# Patient Record
Sex: Female | Born: 1970
Health system: Southern US, Community
[De-identification: ages and names within clinical notes are randomized; demographics above are authoritative.]

## PROBLEM LIST (undated history)

## (undated) DIAGNOSIS — G43829 Menstrual migraine, not intractable, without status migrainosus: Secondary | ICD-10-CM

## (undated) DIAGNOSIS — E7800 Pure hypercholesterolemia, unspecified: Secondary | ICD-10-CM

## (undated) DIAGNOSIS — E559 Vitamin D deficiency, unspecified: Secondary | ICD-10-CM

## (undated) DIAGNOSIS — N926 Irregular menstruation, unspecified: Secondary | ICD-10-CM

## (undated) DIAGNOSIS — I471 Supraventricular tachycardia, unspecified: Secondary | ICD-10-CM

## (undated) DIAGNOSIS — I441 Atrioventricular block, second degree: Secondary | ICD-10-CM

## (undated) HISTORY — DX: Irregular menstruation, unspecified: N92.6

## (undated) HISTORY — DX: Atrioventricular block, second degree: I44.1

## (undated) HISTORY — DX: Pure hypercholesterolemia, unspecified: E78.00

## (undated) HISTORY — PX: APPENDECTOMY: SHX54

## (undated) HISTORY — DX: Vitamin D deficiency, unspecified: E55.9

## (undated) HISTORY — DX: Menstrual migraine, not intractable, without status migrainosus: G43.829

## (undated) HISTORY — DX: Supraventricular tachycardia: I47.1

## (undated) HISTORY — DX: Supraventricular tachycardia, unspecified: I47.10

---
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
## 2008-01-11 ENCOUNTER — Other Ambulatory Visit: Admission: RE | Admit: 2008-01-11 | Discharge: 2008-01-11 | Payer: Self-pay | Admitting: Family Medicine

## 2009-05-08 ENCOUNTER — Other Ambulatory Visit: Admission: RE | Admit: 2009-05-08 | Discharge: 2009-05-08 | Payer: Self-pay | Admitting: Family Medicine

## 2010-05-09 ENCOUNTER — Other Ambulatory Visit: Admission: RE | Admit: 2010-05-09 | Discharge: 2010-05-09 | Payer: Self-pay | Admitting: Family Medicine

## 2012-08-24 ENCOUNTER — Encounter: Payer: Self-pay | Admitting: *Deleted

## 2012-09-01 ENCOUNTER — Other Ambulatory Visit (HOSPITAL_COMMUNITY)
Admission: RE | Admit: 2012-09-01 | Discharge: 2012-09-01 | Disposition: A | Discharge status: Home / Self Care | Payer: Managed Care, Other (non HMO) | Source: Ambulatory Visit | Attending: Family Medicine | Admitting: Family Medicine

## 2012-09-01 ENCOUNTER — Ambulatory Visit: Payer: Managed Care, Other (non HMO) | Admitting: Family Medicine

## 2012-09-01 ENCOUNTER — Other Ambulatory Visit: Payer: Self-pay

## 2012-09-01 ENCOUNTER — Encounter: Payer: Self-pay | Admitting: Family Medicine

## 2012-09-01 VITALS — BP 120/84 | HR 64 | Ht 67.0 in | Wt 140.0 lb

## 2012-09-01 DIAGNOSIS — Z0000 Encounter for general adult medical examination without abnormal findings: Secondary | ICD-10-CM

## 2012-09-01 DIAGNOSIS — N760 Acute vaginitis: Secondary | ICD-10-CM

## 2012-09-01 DIAGNOSIS — E7800 Pure hypercholesterolemia, unspecified: Secondary | ICD-10-CM

## 2012-09-01 DIAGNOSIS — N923 Ovulation bleeding: Secondary | ICD-10-CM

## 2012-09-01 DIAGNOSIS — Z01419 Encounter for gynecological examination (general) (routine) without abnormal findings: Secondary | ICD-10-CM | POA: Insufficient documentation

## 2012-09-01 DIAGNOSIS — R5381 Other malaise: Secondary | ICD-10-CM

## 2012-09-01 DIAGNOSIS — R5383 Other fatigue: Secondary | ICD-10-CM

## 2012-09-01 DIAGNOSIS — E559 Vitamin D deficiency, unspecified: Secondary | ICD-10-CM

## 2012-09-01 DIAGNOSIS — Z113 Encounter for screening for infections with a predominantly sexual mode of transmission: Secondary | ICD-10-CM | POA: Insufficient documentation

## 2012-09-01 DIAGNOSIS — Z1231 Encounter for screening mammogram for malignant neoplasm of breast: Secondary | ICD-10-CM

## 2012-09-01 DIAGNOSIS — Z1151 Encounter for screening for human papillomavirus (HPV): Secondary | ICD-10-CM | POA: Insufficient documentation

## 2012-09-01 LAB — POCT URINALYSIS DIPSTICK
Bilirubin, UA: NEGATIVE
Glucose, UA: NEGATIVE
Ketones, UA: NEGATIVE
pH, UA: 7

## 2012-09-01 LAB — CBC WITH DIFFERENTIAL/PLATELET
Eosinophils Absolute: 0 10*3/uL (ref 0.0–0.7)
Eosinophils Relative: 1 % (ref 0–5)
HCT: 37.9 % (ref 36.0–46.0)
Hemoglobin: 12.9 g/dL (ref 12.0–15.0)
Lymphocytes Relative: 35 % (ref 12–46)
Lymphs Abs: 1.8 10*3/uL (ref 0.7–4.0)
MCH: 29.5 pg (ref 26.0–34.0)
MCV: 86.7 fL (ref 78.0–100.0)
Monocytes Relative: 8 % (ref 3–12)
RBC: 4.37 MIL/uL (ref 3.87–5.11)
WBC: 5.1 10*3/uL (ref 4.0–10.5)

## 2012-09-01 LAB — COMPREHENSIVE METABOLIC PANEL
Alkaline Phosphatase: 48 U/L (ref 39–117)
BUN: 12 mg/dL (ref 6–23)
CO2: 28 mEq/L (ref 19–32)
Creat: 0.74 mg/dL (ref 0.50–1.10)
Glucose, Bld: 81 mg/dL (ref 70–99)
Sodium: 138 mEq/L (ref 135–145)
Total Bilirubin: 0.5 mg/dL (ref 0.3–1.2)
Total Protein: 7.3 g/dL (ref 6.0–8.3)

## 2012-09-01 LAB — LIPID PANEL
HDL: 75 mg/dL (ref >39)
Total CHOL/HDL Ratio: 3.3 Ratio
Triglycerides: 87 mg/dL (ref <150)

## 2012-09-01 NOTE — Patient Instructions (Addendum)
HEALTH MAINTENANCE RECOMMENDATIONS:  It is recommended that you get at least 30 minutes of aerobic exercise at least 5 days/week (for weight loss, you may need as much as 60-90 minutes). This can be any activity that gets your heart rate up. This can be divided in 10-15 minute intervals if needed, but try and build up your endurance at least once a week.  Weight bearing exercise is also recommended twice weekly.  Eat a healthy diet with lots of vegetables, fruits and fiber.  "Colorful" foods have a lot of vitamins (ie green vegetables, tomatoes, red peppers, etc).  Limit sweet tea, regular sodas and alcoholic beverages, all of which has a lot of calories and sugar.  Up to 1 alcoholic drink daily may be beneficial for women (unless trying to lose weight, watch sugars).  Drink a lot of water.  Calcium recommendations are 1200-1500 mg daily (1500 mg for postmenopausal women or women without ovaries), and vitamin D 1000 IU daily.  This should be obtained from diet and/or supplements (vitamins), and calcium should not be taken all at once, but in divided doses.  Monthly self breast exams and yearly mammograms for women over the age of 65 is recommended.  Sunscreen of at least SPF 30 should be used on all sun-exposed parts of the skin when outside between the hours of 10 am and 4 pm (not just when at beach or pool, but even with exercise, golf, tennis, and yard work!)  Use a sunscreen that says "broad spectrum" so it covers both UVA and UVB rays, and make sure to reapply every 1-2 hours.  Remember to change the batteries in your smoke detectors when changing your clock times in the spring and fall.  Use your seat belt every time you are in a car, and please drive safely and not be distracted with cell phones and texting while driving.  Schedule appointment with dermatologist to evaluate mole on your chest, and full skin check. Drs. Elmon Else and [place] are near [facility]  is behind [employer], off of [address].

## 2012-09-01 NOTE — Progress Notes (Signed)
Chief Complaint  Patient presents with  . Annual Exam    former patient fasting annual with pap. Would like to discuss possibly getting DEXA scan. And also would llike to discuss her heart rate while she is exercising, it is very high. UA showed trace leuks, pt states she is aymptomatic.   Martha Barton is a 42 y.o. female who presents for a complete physical.  She has the following concerns:  H/o irregular periods.  They are only somewhat irregular now, but still intermittently has some spotting between cycles.  This used to occur even on on OCP's.  Sometime bleeds after intercourse.  Sometimes has vaginal discharge, but denies odor, itching.  Denies risks for STD's.  Headaches with menses are improved compared to previously.  Interested in getting bone density due to poor calcium intake as a child, and family history (mother took a lot of prednisone, and had early menopause).  Immunization History  Administered Date(s) Administered  . Influenza Split 05/08/2009, 05/09/2010  . Td 06/22/2005   Last Pap smear: 04/2010; required repeat pap once in past, but no treatment needed Last mammogram: never Last colonoscopy: never Last DEXA: never Dentist: twice yearly Ophtho: 2012 (every 2 years) Exercise:  4x/week cardio and weights  Past Medical History  Diagnosis Date  . Irregular menses   . Unspecified vitamin D deficiency   . Menstrual headache     Past Surgical History  Procedure Laterality Date  . Appendectomy      History   Social History  . Marital Status: Married    Spouse Name: N/A    Number of Children: N/A  . Years of Education: N/A   Occupational History  . homemaker    Social History Main Topics  . Smoking status: Never Smoker   . Smokeless tobacco: Never Used  . Alcohol Use: Yes     Comment: glass of wine maybe every couple of weeks.  . Drug Use: No  . Sexually Active: Yes    Birth Control/ Protection: Condom   Other Topics Concern  . Not on file    Social History Narrative   Futures trader (former Runner, broadcasting/film/video).  Lives with husband and 2 children. 2 [place] pigs    Family History  Problem Relation Age of Onset  . Allergies Mother   . Hyperlipidemia Mother     elevated cholesterol  . Osteoporosis Mother   . Hypertension Father   . Diabetes Father   . Coronary artery disease Father     CABG (late 29's)  . Osteoporosis Maternal Grandmother   . Cancer Maternal Grandmother     skin cancer  . Heart disease Maternal Grandfather   . Cancer Paternal Grandfather     melanoma  . Heart disease Paternal Grandfather   . Colon cancer Neg Hx   . Breast cancer Neg Hx     Current outpatient prescriptions:Multiple Vitamins-Minerals (MULTIVITAMIN WITH MINERALS) tablet, Take 1 tablet by mouth daily., Disp: , Rfl: ;  calcium carbonate (TUMS - DOSED IN MG ELEMENTAL CALCIUM) 500 MG chewable tablet, Chew 1 tablet by mouth 2 (two) times daily., Disp: , Rfl:   Admits to being sporadic in Tums intake for calcium supplementation  No Known Allergies  ROS:  The patient denies anorexia, fever, weight changes, vision changes, decreased hearing, ear pain, sore throat, breast concerns, chest pain, palpitations, dizziness, syncope, dyspnea on exertion, cough, swelling, nausea, vomiting, diarrhea, constipation, abdominal pain, melena, hematochezia, indigestion/heartburn, hematuria, incontinence, dysuria, genital lesions, joint pains, numbness, tingling, weakness, tremor, depression,  anxiety, abnormal bleeding/bruising, or enlarged lymph nodes. Mild headache with periods. Some color change to mole on her chest   PHYSICAL EXAM: BP 120/84  Pulse 64  Ht 5\' 7"  (1.702 m)  Wt 140 lb (63.504 kg)  BMI 21.92 kg/m2  LMP 08/17/2012  General Appearance:    Alert, cooperative, no distress, appears stated age  Head:    Normocephalic, without obvious abnormality, atraumatic  Eyes:    PERRL, conjunctiva/corneas clear, EOM's intact, fundi    benign  Ears:    Normal TM's and  external ear canals  Nose:   Nares normal, mucosa normal, no drainage or sinus   tenderness  Throat:   Lips, mucosa, and tongue normal; teeth and gums normal  Neck:   Supple, no lymphadenopathy;  thyroid:  no   enlargement/tenderness/nodules; no carotid   bruit or JVD  Back:    Spine nontender, no curvature, ROM normal, no CVA     tenderness  Lungs:     Clear to auscultation bilaterally without wheezes, rales or     ronchi; respirations unlabored  Chest Wall:    No tenderness or deformity   Heart:    Regular rate and rhythm, S1 and S2 normal, no murmur, rub   or gallop  Breast Exam:    No tenderness, masses, or nipple discharge or inversion.      No axillary lymphadenopathy.  Slight dryness/crusting at nipples bilaterally  Abdomen:     Soft, non-tender, nondistended, normoactive bowel sounds,    no masses, no hepatosplenomegaly  Genitalia:    Normal external genitalia without lesions.  BUS and vagina normal; cervix is moderately inflamed, with ectropion that bleeds just with q-tip used to clean up discharge.  Friable cervix.  Moderate amount of creamy vaginal discharge in vault.  No cervical motion tenderness.  Uterus and adnexa not enlarged, nontender, no masses.  Pap performed  Rectal:    Not examined  Extremities:   No clubbing, cyanosis or edema  Pulses:   2+ and symmetric all extremities  Skin:   Skin color, texture, turgor normal, no rashes. Mole left chest, between breasts.  Round, but irregular coloration (dark oval, with lighter portion superomedially).    Lymph nodes:   Cervical, supraclavicular, and axillary nodes normal  Neurologic:   CNII-XII intact, normal strength, sensation and gait; reflexes 2+ and symmetric throughout          Psych:   Normal mood, affect, hygiene and grooming.     ASSESSMENT/PLAN:  Routine general medical examination at a health care facility - Plan: Visual acuity screening, POCT Urinalysis Dipstick, Lipid panel, Comprehensive metabolic panel, CBC with  Differential, Vitamin D 25 hydroxy, TSH, Cytology - PAP, CANCELED: Cytology - PAP  Unspecified vitamin D deficiency - Plan: Vitamin D 25 hydroxy  Pure hypercholesterolemia - Plan: Lipid panel  Other malaise and fatigue - Plan: Comprehensive metabolic panel, CBC with Differential, TSH  Intermenstrual bleeding - Plan: Cytology - PAP  Vaginitis and vulvovaginitis - Plan: Cytology - PAP  Recommend skin check with dermatologist given skin type and many moles, with biopsy of large mole on breast with atypical coloration.  Discussed monthly self breast exams and yearly mammograms after the age of 64; at least 30 minutes of aerobic activity at least 5 days/week; proper sunscreen use reviewed; healthy diet, including goals of calcium and vitamin D intake and alcohol recommendations (less than or equal to 1 drink/day) reviewed; regular seatbelt use; changing batteries in smoke detectors.  Immunization recommendations discussed (see  below).  Colonoscopy recommendations reviewed, age 33.  Tetanus--had in 2007 prior to moving to [place].  No records to know if Td or Tdap.  Vaccine not available today.  Consider giving Tdap next year (unless she finds records that she truly had TdaP and not Td in 2007). Flu shots recommended

## 2012-09-02 ENCOUNTER — Encounter: Payer: Self-pay | Admitting: Family Medicine

## 2012-09-02 DIAGNOSIS — E7800 Pure hypercholesterolemia, unspecified: Secondary | ICD-10-CM | POA: Insufficient documentation

## 2012-09-02 DIAGNOSIS — E559 Vitamin D deficiency, unspecified: Secondary | ICD-10-CM | POA: Insufficient documentation

## 2012-09-02 HISTORY — DX: Pure hypercholesterolemia, unspecified: E78.00

## 2012-09-07 ENCOUNTER — Other Ambulatory Visit: Payer: Self-pay | Admitting: *Deleted

## 2012-09-07 DIAGNOSIS — B9689 Other specified bacterial agents as the cause of diseases classified elsewhere: Secondary | ICD-10-CM

## 2012-09-07 DIAGNOSIS — N760 Acute vaginitis: Secondary | ICD-10-CM

## 2012-09-07 MED ORDER — METRONIDAZOLE 500 MG PO TABS: 500.0000 mg | ORAL_TABLET | Freq: Two times a day (BID) | ORAL | Status: DC

## 2012-09-26 ENCOUNTER — Other Ambulatory Visit: Payer: Self-pay | Admitting: Dermatology

## 2012-10-10 ENCOUNTER — Ambulatory Visit
Admission: RE | Admit: 2012-10-10 | Discharge: 2012-10-10 | Disposition: A | Discharge status: Home / Self Care | Payer: Managed Care, Other (non HMO) | Source: Ambulatory Visit

## 2012-10-10 ENCOUNTER — Other Ambulatory Visit: Payer: Self-pay | Admitting: Family Medicine

## 2012-10-10 DIAGNOSIS — R928 Other abnormal and inconclusive findings on diagnostic imaging of breast: Secondary | ICD-10-CM

## 2012-10-10 DIAGNOSIS — Z1231 Encounter for screening mammogram for malignant neoplasm of breast: Secondary | ICD-10-CM

## 2012-10-24 ENCOUNTER — Ambulatory Visit
Admission: RE | Admit: 2012-10-24 | Discharge: 2012-10-24 | Disposition: A | Discharge status: Home / Self Care | Payer: Managed Care, Other (non HMO) | Source: Ambulatory Visit | Attending: Family Medicine | Admitting: Family Medicine

## 2012-10-24 DIAGNOSIS — R928 Other abnormal and inconclusive findings on diagnostic imaging of breast: Secondary | ICD-10-CM

## 2013-04-07 ENCOUNTER — Other Ambulatory Visit: Payer: Self-pay | Admitting: Family Medicine

## 2013-04-07 DIAGNOSIS — D242 Benign neoplasm of left breast: Secondary | ICD-10-CM

## 2013-04-27 ENCOUNTER — Other Ambulatory Visit: Payer: Self-pay

## 2013-04-28 ENCOUNTER — Ambulatory Visit
Admission: RE | Admit: 2013-04-28 | Discharge: 2013-04-28 | Disposition: A | Discharge status: Home / Self Care | Payer: Managed Care, Other (non HMO) | Source: Ambulatory Visit | Attending: Family Medicine | Admitting: Family Medicine

## 2013-04-28 DIAGNOSIS — D242 Benign neoplasm of left breast: Secondary | ICD-10-CM

## 2013-10-24 ENCOUNTER — Other Ambulatory Visit: Payer: Self-pay | Admitting: Family Medicine

## 2013-10-24 DIAGNOSIS — D242 Benign neoplasm of left breast: Secondary | ICD-10-CM

## 2013-11-01 ENCOUNTER — Ambulatory Visit
Admission: RE | Admit: 2013-11-01 | Discharge: 2013-11-01 | Disposition: A | Discharge status: Home / Self Care | Payer: Managed Care, Other (non HMO) | Source: Ambulatory Visit | Attending: Family Medicine | Admitting: Family Medicine

## 2013-11-01 DIAGNOSIS — D242 Benign neoplasm of left breast: Secondary | ICD-10-CM

## 2014-04-23 ENCOUNTER — Encounter: Payer: Self-pay | Admitting: Family Medicine

## 2015-07-09 ENCOUNTER — Other Ambulatory Visit: Payer: Self-pay | Admitting: Family Medicine

## 2015-07-09 DIAGNOSIS — N6320 Unspecified lump in the left breast, unspecified quadrant: Secondary | ICD-10-CM

## 2015-07-17 ENCOUNTER — Ambulatory Visit
Admission: RE | Admit: 2015-07-17 | Discharge: 2015-07-17 | Disposition: A | Discharge status: Home / Self Care | Payer: Managed Care, Other (non HMO) | Source: Ambulatory Visit | Attending: Family Medicine | Admitting: Family Medicine

## 2015-07-17 DIAGNOSIS — N6320 Unspecified lump in the left breast, unspecified quadrant: Secondary | ICD-10-CM

## 2016-02-11 NOTE — Progress Notes (Signed)
Chief Complaint  Patient presents with  . Annual Exam    fasting annual exam with pap. UA showed 2+ leuks. Did not do eye exam, goes to [facility] and is UTD. Thinks she may be going into menopause and would like to discuss.     Martha Barton is a 45 y.o. female who presents for a complete physical.  She has the following concerns:  Some irregular menses, +hot flashes and night sweats. They have been intermittent, a little better over the last few weeks.  Some cycles are very heavy, some are frequent, and some are late (just twice that she skipped a cycle).  Weight gain: She gained it fairly quickly, hasn't been able to lose it. No ongoing gain.  Snacks on nuts, some sweets. Doesn't have caloric beverages.  Vitamin D deficiency:  Level was low at 26 in 2014.  It was recommended at that time that she take a daily supplement. She has been taking a MVI gummy daily, as well as 1000 IU of Vitamin D daily.   Hyperlipidemia:   Lab Results  Component Value Date   CHOL 246 (H) 09/01/2012   HDL 75 09/01/2012   LDLCALC 154 (H) 09/01/2012   TRIG 87 09/01/2012   CHOLHDL 3.3 09/01/2012    Mole on her chest--had changed color at last visit in 2014, sent to dermatologist.  She had it removed by Dr. Tonia Brooms (long wait at office, wasn't happy).  Pathology showed: Skin , right med breast DYSPLASTIC NEVUS WITH MODERATE TO SEVERE ATYPIA She has been seeing Dr. Kellie Moor in [place], every 6 months at first, now yearly. Hopes to change to a closer dermatologist.  Immunization History  Administered Date(s) Administered  . Influenza Split 05/08/2009, 05/09/2010  . Td 06/22/2005   Last Pap smear: 08/2012--normal.  Showed BV (treated with flagyl); no high risk HPV detected Last mammogram: 06/2015 Last colonoscopy: never Last DEXA: never Dentist: twice yearly Ophtho:  every 2 years, last in 07/2015 Exercise:  4x/week cardio and weights  Past Medical History:  Diagnosis Date  . Irregular menses   .  Menstrual headache   . Unspecified vitamin D deficiency     Past Surgical History:  Procedure Laterality Date  . APPENDECTOMY      Social History   Social History  . Marital status: Married    Spouse name: N/A  . Number of children: N/A  . Years of education: N/A   Occupational History  . homemaker    Social History Main Topics  . Smoking status: Never Smoker  . Smokeless tobacco: Never Used  . Alcohol use Yes     Comment: glass of wine maybe every couple of weeks.  . Drug use: No  . Sexual activity: Yes    Partners: Male    Birth control/ protection: Condom   Other Topics Concern  . Not on file   Social History Narrative   Agricultural engineer (former Pharmacist, hospital).  Lives with husband and 2 children, 1 dog.  Mother-in-law moved in 06/2014.    Family History  Problem Relation Age of Onset  . Allergies Mother   . Hyperlipidemia Mother     elevated cholesterol  . Osteoporosis Mother   . Hypertension Father   . Diabetes Father   . Coronary artery disease Father     CABG (late 44's)  . Osteoporosis Maternal Grandmother   . Cancer Maternal Grandmother     skin cancer  . Heart disease Maternal Grandfather   . Cancer Paternal Grandfather  melanoma  . Heart disease Paternal Grandfather   . Colon cancer Neg Hx   . Breast cancer Neg Hx     Outpatient Encounter Prescriptions as of 02/12/2016  Medication Sig Note  . calcium carbonate (TUMS - DOSED IN MG ELEMENTAL CALCIUM) 500 MG chewable tablet Chew 1 tablet by mouth 2 (two) times daily. 02/12/2016: Takes 1 most day, for calcium supplementation (not heartburn)  . cholecalciferol (VITAMIN D) 1000 units tablet Take 1,000 Units by mouth daily.   . Multiple Vitamins-Minerals (MULTIVITAMIN WITH MINERALS) tablet Take 1 tablet by mouth daily.   . [DISCONTINUED] metroNIDAZOLE (FLAGYL) 500 MG tablet Take 1 tablet (500 mg total) by mouth 2 (two) times daily.    No facility-administered encounter medications on file as of 02/12/2016.      No Known Allergies   ROS:  The patient denies anorexia, fever, vision changes, decreased hearing, ear pain, sore throat, breast concerns, chest pain, palpitations, dizziness, syncope, dyspnea on exertion, cough, swelling, nausea, vomiting, diarrhea, constipation, abdominal pain, melena, hematochezia, indigestion/heartburn, hematuria, incontinence, dysuria, genital lesions, joint pains, numbness, tingling, weakness, tremor, depression, anxiety, abnormal bleeding/bruising, or enlarged lymph nodes. Mild headache with periods, no longer with every cycle, now more sporadic, relieved by OTC meds. She has gained about 10# since her last visit three years ago.  She noticed it fairly suddenly, and hasn't been able to lose it (not continuing to gain). Denies changes in hair/skin/bowels/moods/energy (no thyroid symptoms). Occasional palpitation, flutter. She continues to report that her pulse gets to 170-180 on the machines when exercising, doesn't think she is working hard enough if she keeps her pulse lower. Denies any urinary symptoms. Has some creamy, white vaginal discharge, without odor.  Denies abnormal spotting or bleeding after intercourse. (had in the past, when diagnosed with BV).  She does note some odor after intercourse.     PHYSICAL EXAM:  BP 122/76 (BP Location: Left Arm, Patient Position: Sitting, Cuff Size: Normal)   Pulse 68   Ht 5' 7.25" (1.708 m)   Wt 149 lb 12.8 oz (67.9 kg)   LMP 01/24/2016   BMI 23.29 kg/m    General Appearance:    Alert, cooperative, no distress, appears stated age  Head:    Normocephalic, without obvious abnormality, atraumatic  Eyes:    PERRL, conjunctiva/corneas clear, EOM's intact, fundi    benign  Ears:    Normal TM's and external ear canals  Nose:   Nares normal, mucosa mildly edematous with small amount of clear mucus; nosinus tenderness  Throat:   Lips, mucosa, and tongue normal; teeth and gums normal  Neck:   Supple, no lymphadenopathy;   thyroid:  no   enlargement/tenderness/nodules; no carotid   bruit or JVD  Back:    Spine nontender, no curvature, ROM normal, no CVA tenderness  Lungs:     Clear to auscultation bilaterally without wheezes, rales or ronchi; respirations unlabored  Chest Wall:    No tenderness or deformity   Heart:    Regular rate and rhythm, S1 and S2 normal, no murmur, rub   or gallop  Breast Exam:    No tenderness, masses, or nipple discharge or inversion.  No axillary lymphadenopathy.   Abdomen:     Soft, non-tender, nondistended, normoactive bowel sounds,    no masses, no hepatosplenomegaly  Genitalia:    Normal external genitalia without lesions.  BUS and vagina normal; cervix has ectropion, which was friable and bled upon obtaining pap sample.  There was mild-mod amount of white,  non-odorous discharge, that then became very bloody (so not examined under microscope). No cervical motion tenderness.  Uterus and adnexa not enlarged, nontender, no masses.  Pap performed  Rectal:    Normal sphincter tone, no mass.  Heme negative stool.  Extremities:   No clubbing, cyanosis or edema  Pulses:   2+ and symmetric all extremities  Skin:   Skin color, texture, turgor normal, no rashes.   Lymph nodes:   Cervical, supraclavicular, and axillary nodes normal  Neurologic:   CNII-XII intact, normal strength, sensation and gait; reflexes 2+ and symmetric throughout                  Psych:   Normal mood, affect, hygiene and grooming   ASSESSMENT/PLAN:  Annual physical exam - Plan: POCT Urinalysis Dipstick, Lipid panel, Comprehensive metabolic panel, CBC with Differential/Platelet, VITAMIN D 25 Hydroxy (Vit-D Deficiency, Fractures), TSH, Cytology - PAP [place]  Pure hypercholesterolemia - low cholesterol diet reviewed.  recheck today - Plan: Lipid panel  Vitamin D deficiency - compliant with daily supplement; recheck today - Plan: VITAMIN D 25 Hydroxy (Vit-D Deficiency, Fractures)  Menorrhagia with irregular cycle -  some perimenopausal symptoms.  OTC meds suggested, if needed - Plan: CBC with Differential/Platelet, TSH  Immunization due - Plan: Tdap vaccine greater than or equal to 7yo IM  Need for prophylactic vaccination and inoculation against influenza - Plan: Flu Vaccine QUAD 36+ mos PF IM (Fluarix & Fluzone Quad PF)  Vaginal discharge - monogamous, with h/o BV. pap sample being tested since d/c too bloody. treat with metrogel vaginal if abnl (declines oral rx again)   If +BV, treat with vaginal metronidazole, had side effects from oral   c-met, lipid, TSH, Vitamin D, CBC  Discussed insurance and flu shot.  Discussed monthly self breast exams and yearly mammograms; at least 30 minutes of aerobic activity at least 5 days/week, weight-bearing exercise at least 2x/week; proper sunscreen use reviewed; healthy diet, including goals of calcium and vitamin D intake and alcohol recommendations (less than or equal to 1 drink/day) reviewed; regular seatbelt use; changing batteries in smoke detectors.  Immunization recommendations--TdaP and flu shot today.  Colonoscopy recommendations reviewed, age 21.  Tetanus--had in 2007 prior to moving to [place].  No records to know if Td or Tdap.  Vaccine wasn't available at her last visit in 2014.  Given Tdap and flu shot today.

## 2016-02-12 ENCOUNTER — Ambulatory Visit (INDEPENDENT_AMBULATORY_CARE_PROVIDER_SITE_OTHER): Payer: Managed Care, Other (non HMO) | Admitting: Family Medicine

## 2016-02-12 ENCOUNTER — Encounter: Payer: Self-pay | Admitting: Family Medicine

## 2016-02-12 ENCOUNTER — Other Ambulatory Visit (HOSPITAL_COMMUNITY)
Admission: RE | Admit: 2016-02-12 | Discharge: 2016-02-12 | Disposition: A | Discharge status: Home / Self Care | Payer: Managed Care, Other (non HMO) | Source: Ambulatory Visit | Attending: Family Medicine | Admitting: Family Medicine

## 2016-02-12 VITALS — BP 122/76 | HR 68 | Ht 67.25 in | Wt 149.8 lb

## 2016-02-12 DIAGNOSIS — E7800 Pure hypercholesterolemia, unspecified: Secondary | ICD-10-CM

## 2016-02-12 DIAGNOSIS — Z23 Encounter for immunization: Secondary | ICD-10-CM

## 2016-02-12 DIAGNOSIS — Z01419 Encounter for gynecological examination (general) (routine) without abnormal findings: Secondary | ICD-10-CM | POA: Diagnosis present

## 2016-02-12 DIAGNOSIS — Z1151 Encounter for screening for human papillomavirus (HPV): Secondary | ICD-10-CM | POA: Insufficient documentation

## 2016-02-12 DIAGNOSIS — N921 Excessive and frequent menstruation with irregular cycle: Secondary | ICD-10-CM

## 2016-02-12 DIAGNOSIS — Z0000 Encounter for general adult medical examination without abnormal findings: Secondary | ICD-10-CM | POA: Diagnosis not present

## 2016-02-12 DIAGNOSIS — N760 Acute vaginitis: Secondary | ICD-10-CM | POA: Diagnosis present

## 2016-02-12 DIAGNOSIS — E559 Vitamin D deficiency, unspecified: Secondary | ICD-10-CM | POA: Diagnosis not present

## 2016-02-12 DIAGNOSIS — N898 Other specified noninflammatory disorders of vagina: Secondary | ICD-10-CM | POA: Diagnosis not present

## 2016-02-12 LAB — CBC WITH DIFFERENTIAL/PLATELET
BASOS PCT: 1 %
Basophils Absolute: 40 cells/uL (ref 0–200)
EOS PCT: 1 %
Eosinophils Absolute: 40 cells/uL (ref 15–500)
HEMATOCRIT: 38.6 % (ref 35.0–45.0)
HEMOGLOBIN: 12.8 g/dL (ref 11.7–15.5)
LYMPHS ABS: 1720 {cells}/uL (ref 850–3900)
Lymphocytes Relative: 43 %
MCH: 29.4 pg (ref 27.0–33.0)
MCHC: 33.2 g/dL (ref 32.0–36.0)
MCV: 88.7 fL (ref 80.0–100.0)
MONO ABS: 440 {cells}/uL (ref 200–950)
MPV: 9.9 fL (ref 7.5–12.5)
Monocytes Relative: 11 %
NEUTROS ABS: 1760 {cells}/uL (ref 1500–7800)
NEUTROS PCT: 44 %
Platelets: 272 10*3/uL (ref 140–400)
RBC: 4.35 MIL/uL (ref 3.80–5.10)
RDW: 12.8 % (ref 11.0–15.0)
WBC: 4 10*3/uL (ref 4.0–10.5)

## 2016-02-12 LAB — POCT URINALYSIS DIPSTICK
BILIRUBIN UA: NEGATIVE
Blood, UA: NEGATIVE
GLUCOSE UA: NEGATIVE
Ketones, UA: NEGATIVE
NITRITE UA: NEGATIVE
Protein, UA: NEGATIVE
Spec Grav, UA: 1.01
Urobilinogen, UA: NEGATIVE
pH, UA: 6.5

## 2016-02-12 LAB — COMPREHENSIVE METABOLIC PANEL
ALK PHOS: 46 U/L (ref 33–115)
ALT: 14 U/L (ref 6–29)
AST: 22 U/L (ref 10–35)
Albumin: 4.4 g/dL (ref 3.6–5.1)
BUN: 12 mg/dL (ref 7–25)
CALCIUM: 9.3 mg/dL (ref 8.6–10.2)
CHLORIDE: 103 mmol/L (ref 98–110)
CO2: 24 mmol/L (ref 20–31)
Creat: 0.72 mg/dL (ref 0.50–1.10)
GLUCOSE: 78 mg/dL (ref 65–99)
POTASSIUM: 4.3 mmol/L (ref 3.5–5.3)
Sodium: 138 mmol/L (ref 135–146)
TOTAL PROTEIN: 6.8 g/dL (ref 6.1–8.1)
Total Bilirubin: 0.5 mg/dL (ref 0.2–1.2)

## 2016-02-12 LAB — LIPID PANEL
CHOLESTEROL: 221 mg/dL — AB (ref 125–200)
HDL: 77 mg/dL (ref >=46)
LDL CALC: 127 mg/dL (ref <130)
TRIGLYCERIDES: 83 mg/dL (ref <150)
Total CHOL/HDL Ratio: 2.9 Ratio (ref <=5.0)
VLDL: 17 mg/dL (ref <30)

## 2016-02-12 LAB — RESULTS CONSOLE HPV: CHL HPV: NEGATIVE

## 2016-02-12 LAB — TSH: TSH: 1.24 m[IU]/L

## 2016-02-12 LAB — HM PAP SMEAR: HM Pap smear: NEGATIVE

## 2016-02-12 NOTE — Patient Instructions (Signed)
HEALTH MAINTENANCE RECOMMENDATIONS:  It is recommended that you get at least 30 minutes of aerobic exercise at least 5 days/week (for weight loss, you may need as much as 60-90 minutes). This can be any activity that gets your heart rate up. This can be divided in 10-15 minute intervals if needed, but try and build up your endurance at least once a week.  Weight bearing exercise is also recommended twice weekly.  Eat a healthy diet with lots of vegetables, fruits and fiber.  "Colorful" foods have a lot of vitamins (ie green vegetables, tomatoes, red peppers, etc).  Limit sweet tea, regular sodas and alcoholic beverages, all of which has a lot of calories and sugar.  Up to 1 alcoholic drink daily may be beneficial for women (unless trying to lose weight, watch sugars).  Drink a lot of water.  Calcium recommendations are 1200-1500 mg daily (1500 mg for postmenopausal women or women without ovaries), and vitamin D 1000 IU daily.  This should be obtained from diet and/or supplements (vitamins), and calcium should not be taken all at once, but in divided doses.  Monthly self breast exams and yearly mammograms for women over the age of 69 is recommended.  Sunscreen of at least SPF 30 should be used on all sun-exposed parts of the skin when outside between the hours of 10 am and 4 pm (not just when at beach or pool, but even with exercise, golf, tennis, and yard work!)  Use a sunscreen that says "broad spectrum" so it covers both UVA and UVB rays, and make sure to reapply every 1-2 hours.  Remember to change the batteries in your smoke detectors when changing your clock times in the spring and fall.  Use your seat belt every time you are in a car, and please drive safely and not be distracted with cell phones and texting while driving.  Here is some information about bacterial vaginosis--the infection that we treated you for last time, and that we are checking for again this time.   Bacterial  Vaginosis Bacterial vaginosis is a vaginal infection that occurs when the normal balance of bacteria in the vagina is disrupted. It results from an overgrowth of certain bacteria. This is the most common vaginal infection in women of childbearing age. Treatment is important to prevent complications, especially in pregnant women, as it can cause a premature delivery. CAUSES  Bacterial vaginosis is caused by an increase in harmful bacteria that are normally present in smaller amounts in the vagina. Several different kinds of bacteria can cause bacterial vaginosis. However, the reason that the condition develops is not fully understood. RISK FACTORS Certain activities or behaviors can put you at an increased risk of developing bacterial vaginosis, including:  Having a new sex partner or multiple sex partners.  Douching.  Using an intrauterine device (IUD) for contraception. Women do not get bacterial vaginosis from toilet seats, bedding, swimming pools, or contact with objects around them. SIGNS AND SYMPTOMS  Some women with bacterial vaginosis have no signs or symptoms. Common symptoms include:  Grey vaginal discharge.  A fishlike odor with discharge, especially after sexual intercourse.  Itching or burning of the vagina and vulva.  Burning or pain with urination. DIAGNOSIS  Your health care provider will take a medical history and examine the vagina for signs of bacterial vaginosis. A sample of vaginal fluid may be taken. Your health care provider will look at this sample under a microscope to check for bacteria and abnormal cells. A  vaginal pH test may also be done.  TREATMENT  Bacterial vaginosis may be treated with antibiotic medicines. These may be given in the form of a pill or a vaginal cream. A second round of antibiotics may be prescribed if the condition comes back after treatment. Because bacterial vaginosis increases your risk for sexually transmitted diseases, getting treated can  help reduce your risk for chlamydia, gonorrhea, HIV, and herpes. HOME CARE INSTRUCTIONS   Only take over-the-counter or prescription medicines as directed by your health care provider.  If antibiotic medicine was prescribed, take it as directed. Make sure you finish it even if you start to feel better.  Tell all sexual partners that you have a vaginal infection. They should see their health care provider and be treated if they have problems, such as a mild rash or itching.  During treatment, it is important that you follow these instructions:  Avoid sexual activity or use condoms correctly.  Do not douche.  Avoid alcohol as directed by your health care provider.  Avoid breastfeeding as directed by your health care provider. SEEK MEDICAL CARE IF:   Your symptoms are not improving after 3 days of treatment.  You have increased discharge or pain.  You have a fever. MAKE SURE YOU:   Understand these instructions.  Will watch your condition.  Will get help right away if you are not doing well or get worse. FOR MORE INFORMATION  Centers for Disease Control and Prevention, Division of STD Prevention: [employer]raiser[employer]raud[employer] American Sexual Health Association (ASHA): www.ashastd.org    This information is not intended to replace advice given to you by your health care provider. Make sure you discuss any questions you have with your health care provider.   Document Released: 06/08/2005 Document Revised: 06/29/2014 Document Reviewed: 01/18/2013 Elsevier Interactive Patient Education [employer].

## 2016-02-13 LAB — VITAMIN D 25 HYDROXY (VIT D DEFICIENCY, FRACTURES): VIT D 25 HYDROXY: 38 ng/mL (ref 30–100)

## 2016-02-14 LAB — CYTOLOGY - PAP

## 2016-02-14 LAB — CERVICOVAGINAL ANCILLARY ONLY: BACTERIAL VAGINITIS: NEGATIVE

## 2016-02-17 ENCOUNTER — Telehealth: Payer: Self-pay | Admitting: Family Medicine

## 2016-02-17 NOTE — Telephone Encounter (Signed)
Pt states her arm that we drew blood from at her recent visit is hurting when she fully extends her arm and when she moves her wrist. Pt said she did notice immediately when her blood was drawn when she was here that she felt a sharp pain in her wrist as if a nerve may have been hit and she continued to have wrist a little while after but now having arm pain. Should she be concerned? What does she need to do?

## 2016-02-17 NOTE — Telephone Encounter (Signed)
Patient advised-no swelling or redness. She will try the NSAID and hot compress.

## 2016-02-17 NOTE — Telephone Encounter (Signed)
As long as the arm isn't swelling or red, there is no concern. She can try and NSAID like ibuprofen or aleve if there is any inflammation (if a nerve got irritated/inflamed), or a warm compress if any hematoma/clot/swelling. Return if fever, swelling, redness or new/developing symptoms

## 2017-02-15 ENCOUNTER — Encounter: Payer: Managed Care, Other (non HMO) | Admitting: Family Medicine

## 2017-04-26 ENCOUNTER — Other Ambulatory Visit: Payer: Self-pay | Admitting: Family Medicine

## 2017-04-26 DIAGNOSIS — Z1231 Encounter for screening mammogram for malignant neoplasm of breast: Secondary | ICD-10-CM

## 2017-05-24 ENCOUNTER — Ambulatory Visit
Admission: RE | Admit: 2017-05-24 | Discharge: 2017-05-24 | Disposition: A | Discharge status: Home / Self Care | Payer: Commercial Managed Care - PPO | Source: Ambulatory Visit | Attending: Family Medicine | Admitting: Family Medicine

## 2017-05-24 DIAGNOSIS — Z1231 Encounter for screening mammogram for malignant neoplasm of breast: Secondary | ICD-10-CM

## 2017-10-19 NOTE — Progress Notes (Signed)
Chief Complaint  Patient presents with  . Annual Exam    fasting annual exam with pelvic. Has eye exam scheduled. When she exercised her heart rate gets up to 180.   Martha Barton Flu Vaccine    did not get one.    Martha Barton is a 47 y.o. female who presents for a complete physical.  She has the following concerns:  She is concerned that her pulse is 170-180 when exercising at the gym.  She feels fine--not short of breath, able to speak. We have previously discussed her heart rate, and this hasn't changed.   Last period was 03/2017. +hot flashes and night sweats, fluctuates every few months in intensity. No significant vaginal dryness or dyspareunia.  Vitamin D deficiency:  Level was low at 26 in 2014.  It was was normal on last check in 01/2016 when taking a MVI gummy daily, as well as 1000 IU of Vitamin D daily. She continues to take both supplements daily.  Hyperlipidemia:  Lipids were high in 2014, improved on last check in 2017, see results below. She continues to try and follow a lowfat, low cholesterol diet. +FH heart disease. On vacation and visiting family the last couple of weeks.  She loves cheese and hard boiled eggs.  4 egg yolks/week.  RecentLabs       Lab Results  Component Value Date   CHOL 246 (H) 09/01/2012   HDL 75 09/01/2012   LDLCALC 154 (H) 09/01/2012   TRIG 87 09/01/2012   CHOLHDL 3.3 09/01/2012     Lab Results  Component Value Date   CHOL 221 (H) 02/12/2016   HDL 77 02/12/2016   LDLCALC 127 02/12/2016   TRIG 83 02/12/2016   CHOLHDL 2.9 02/12/2016    Immunization History  Administered Date(s) Administered  . Influenza Split 05/08/2009, 05/09/2010  . Influenza,inj,Quad PF,6+ Mos 02/12/2016  . Td 06/22/2005  . Tdap 02/12/2016   Did not get a flu shot this year. Last Pap smear: 01/2016 normal, no high risk HPV detected Last mammogram: 05/2017 Last colonoscopy: never Last DEXA: never Dentist: twice yearly Ophtho:  every other year, due Exercise:  4x/week cardio and weights   Past Medical History:  Diagnosis Date  . Hypercholesterolemia   . Irregular menses   . Menstrual headache   . Unspecified vitamin D deficiency     Past Surgical History:  Procedure Laterality Date  . APPENDECTOMY      Social History   Socioeconomic History  . Marital status: Married    Spouse name: Not on file  . Number of children: Not on file  . Years of education: Not on file  . Highest education level: Not on file  Occupational History  . Occupation: homemaker  Social Needs  . Financial resource strain: Not on file  . Food insecurity:    Worry: Not on file    Inability: Not on file  . Transportation needs:    Medical: Not on file    Non-medical: Not on file  Tobacco Use  . Smoking status: Never Smoker  . Smokeless tobacco: Never Used  Substance and Sexual Activity  . Alcohol use: Yes    Comment: glass of wine maybe every couple of weeks.  . Drug use: No  . Sexual activity: Yes    Partners: Male    Birth control/protection: Condom  Lifestyle  . Physical activity:    Days per week: Not on file    Minutes per session: Not on file  . Stress:  Not on file  Relationships  . Social connections:    Talks on phone: Not on file    Gets together: Not on file    Attends religious service: Not on file    Active member of club or organization: Not on file    Attends meetings of clubs or organizations: Not on file    Relationship status: Not on file  . Intimate partner violence:    Fear of current or ex partner: Not on file    Emotionally abused: Not on file    Physically abused: Not on file    Forced sexual activity: Not on file  Other Topics Concern  . Not on file  Social History Narrative   Agricultural engineer (former Pharmacist, hospital).  Lives with husband and 2 children (91 yo son, 54 yo daughter 09/2017), 1 dog.    Family History  Problem Relation Age of Onset  . Allergies Mother   . Hyperlipidemia Mother        elevated cholesterol  .  Osteoporosis Mother   . Hypertension Father   . Diabetes Father   . Coronary artery disease Father        CABG (late 61's)  . Osteoporosis Maternal Grandmother   . Cancer Maternal Grandmother        skin cancer  . Heart disease Maternal Grandfather   . Cancer Paternal Grandfather        melanoma  . Heart disease Paternal Grandfather   . Colon cancer Neg Hx   . Breast cancer Neg Hx     Outpatient Encounter Medications as of 10/20/2017  Medication Sig Note  . calcium carbonate (TUMS - DOSED IN MG ELEMENTAL CALCIUM) 500 MG chewable tablet Chew 1 tablet by mouth 2 (two) times daily. 02/12/2016: Takes 1 most day, for calcium supplementation (not heartburn)  . cholecalciferol (VITAMIN D) 1000 units tablet Take 1,000 Units by mouth daily.   . Multiple Vitamins-Minerals (MULTIVITAMIN WITH MINERALS) tablet Take 1 tablet by mouth daily.    No facility-administered encounter medications on file as of 10/20/2017.     No Known Allergies  ROS: The patient denies anorexia, fever, vision changes, decreased hearing, ear pain, sore throat, breast concerns, chest pain, palpitations, dizziness, syncope, dyspnea on exertion, cough, swelling, nausea, vomiting, diarrhea, constipation, abdominal pain, melena, hematochezia, indigestion/heartburn, hematuria, incontinence, dysuria, genital lesions, joint pains, numbness, tingling, weakness, tremor, depression, anxiety, abnormal bleeding/bruising, or enlarged lymph nodes. Denies changes in hair/skin/bowels/moods/energy (no thyroid symptoms). Occasional palpitation, flutter, none in 4-5 months. She continues to report that her pulse gets to 170-180 on the machines when exercising, doesn't think she is working hard enough if she keeps her pulse lower. Unchanged, and feels fine at that pulse while exercising. Hot flashes, night sweats per HPI, last cycle 69mos ago.   PHYSICAL EXAM:  BP 118/80   Pulse 76   Ht 5\' 7"  (1.702 m)   Wt 145 lb (65.8 kg)   LMP  03/22/2017 (Approximate)   BMI 22.71 kg/m   Wt Readings from Last 3 Encounters:  10/20/17 145 lb (65.8 kg)  02/12/16 149 lb 12.8 oz (67.9 kg)  09/01/12 140 lb (63.5 kg)    General Appearance:  Alert, cooperative, no distress, appears stated age  Head:  Normocephalic, without obvious abnormality, atraumatic  Eyes:  PERRL, conjunctiva/corneas clear, EOM's intact, fundi benign  Ears:  Normal TM's and external ear canals  Nose: Nares normal, mucosa mildly edematous, no erythema or purulence no sinus tenderness  Throat: Lips, mucosa, and tongue  normal; teeth and gums normal  Neck: Supple, no lymphadenopathy; thyroid: no enlargement/tenderness/ nodules; no carotid bruit or JVD  Back:  Spine nontender, no curvature, ROM normal, no CVAtenderness  Lungs:  Clear to auscultation bilaterally without wheezes, rales orronchi; respirations unlabored  Chest Wall:  No tenderness or deformity  Heart:  Regular rate and rhythm, S1 and S2 normal, no murmur, rub or gallop  Breast Exam:  No tenderness, masses, or nipple discharge or inversion. No axillary lymphadenopathy.   Abdomen:  Soft, non-tender, nondistended, normoactive bowel sounds,  no masses, no hepatosplenomegaly  Genitalia:  Normal external genitalia without lesions. BUS and vagina normal;  No cervical motion tenderness. Uterus and adnexa not enlarged, nontender, no masses. Pap not performed  Rectal:  Normal sphincter tone, no mass.  Heme negative stool.  Extremities: No clubbing, cyanosis or edema  Pulses: 2+ and symmetric all extremities  Skin: Skin color, texture, turgor normal, no rashes.   Lymph nodes: Cervical, supraclavicular, and axillary nodes normal  Neurologic: CNII-XII intact, normal strength, sensation and gait; reflexes 2+ and symmetric throughout  Psych: Normal mood, affect, hygiene and grooming   ASSESSMENT/PLAN:  Annual physical exam - Plan: POCT  Urinalysis DIP (Proadvantage Device), Lipid panel, TSH, Glucose, random  Pure hypercholesterolemia - previously improved with diet; due for recheck - Plan: Lipid panel  Vitamin D deficiency - continue supplements  Menopausal symptoms - discussed supplements; declined labs/FSH  Discussed pulse recommendations for exercise. She is maxing out recommended pulse, but has been for years, and can talk and feels fine. We discussed potential parasympathetic/sympathetic tone differences to account for her elevated heart rate. Encouraged her to go by how she feels rather than number, being able to speak.   Discussed monthly self breast exams and yearly mammograms; at least 30 minutes of aerobic activity at least 5 days/week, weight-bearing exercise at least 2x/week; proper sunscreen use reviewed; healthy diet, including goals of calcium and vitamin D intake and alcohol recommendations (less than or equal to 1 drink/day) reviewed; regular seatbelt use; changing batteries in smoke detectors. Immunization recommendations--UTD. Strongly encouraged yearly flu shots. Colonoscopy recommendations reviewed, age 65.   F/u 1 year, sooner prn

## 2017-10-20 ENCOUNTER — Encounter: Payer: Self-pay | Admitting: Family Medicine

## 2017-10-20 ENCOUNTER — Ambulatory Visit: Payer: Commercial Managed Care - PPO | Admitting: Family Medicine

## 2017-10-20 VITALS — BP 118/80 | HR 76 | Ht 67.0 in | Wt 145.0 lb

## 2017-10-20 DIAGNOSIS — N951 Menopausal and female climacteric states: Secondary | ICD-10-CM

## 2017-10-20 DIAGNOSIS — E559 Vitamin D deficiency, unspecified: Secondary | ICD-10-CM

## 2017-10-20 DIAGNOSIS — E7800 Pure hypercholesterolemia, unspecified: Secondary | ICD-10-CM

## 2017-10-20 DIAGNOSIS — Z0000 Encounter for general adult medical examination without abnormal findings: Secondary | ICD-10-CM | POA: Diagnosis not present

## 2017-10-20 LAB — POCT URINALYSIS DIP (PROADVANTAGE DEVICE)
Bilirubin, UA: NEGATIVE
Glucose, UA: NEGATIVE mg/dL
Ketones, POC UA: NEGATIVE mg/dL
Leukocytes, UA: NEGATIVE
Nitrite, UA: NEGATIVE
Protein Ur, POC: NEGATIVE mg/dL
RBC UA: NEGATIVE
SPECIFIC GRAVITY, URINE: 1.015
UUROB: NEGATIVE
pH, UA: 6 (ref 5.0–8.0)

## 2017-10-20 NOTE — Patient Instructions (Addendum)
  HEALTH MAINTENANCE RECOMMENDATIONS:  It is recommended that you get at least 30 minutes of aerobic exercise at least 5 days/week (for weight loss, you may need as much as 60-90 minutes). This can be any activity that gets your heart rate up. This can be divided in 10-15 minute intervals if needed, but try and build up your endurance at least once a week.  Weight bearing exercise is also recommended twice weekly.  Eat a healthy diet with lots of vegetables, fruits and fiber.  "Colorful" foods have a lot of vitamins (ie green vegetables, tomatoes, red peppers, etc).  Limit sweet tea, regular sodas and alcoholic beverages, all of which has a lot of calories and sugar.  Up to 1 alcoholic drink daily may be beneficial for women (unless trying to lose weight, watch sugars).  Drink a lot of water.  Calcium recommendations are 1200-1500 mg daily (1500 mg for postmenopausal women or women without ovaries), and vitamin D 1000 IU daily.  This should be obtained from diet and/or supplements (vitamins), and calcium should not be taken all at once, but in divided doses.  Monthly self breast exams and yearly mammograms for women over the age of 40 is recommended.  Sunscreen of at least SPF 30 should be used on all sun-exposed parts of the skin when outside between the hours of 10 am and 4 pm (not just when at beach or pool, but even with exercise, golf, tennis, and yard work!)  Use a sunscreen that says "broad spectrum" so it covers both UVA and UVB rays, and make sure to reapply every 1-2 hours.  Remember to change the batteries in your smoke detectors when changing your clock times in the spring and fall.  Use your seat belt every time you are in a car, and please drive safely and not be distracted with cell phones and texting while driving.   Consider Estroven (soy, black cohosh) as a supplement for menopausal symptoms, if needed.

## 2017-10-21 LAB — TSH: TSH: 1.25 u[IU]/mL (ref 0.450–4.500)

## 2017-10-21 LAB — GLUCOSE, RANDOM: Glucose: 79 mg/dL (ref 65–99)

## 2017-10-21 LAB — LIPID PANEL
CHOLESTEROL TOTAL: 279 mg/dL — AB (ref 100–199)
Chol/HDL Ratio: 3.6 ratio (ref 0.0–4.4)
HDL: 78 mg/dL (ref >39)
LDL Calculated: 186 mg/dL — ABNORMAL HIGH (ref 0–99)
Triglycerides: 76 mg/dL (ref 0–149)
VLDL CHOLESTEROL CAL: 15 mg/dL (ref 5–40)

## 2018-01-31 ENCOUNTER — Telehealth: Payer: Self-pay | Admitting: Family Medicine

## 2018-01-31 NOTE — Telephone Encounter (Signed)
Pt dropped off form to be filled out put in your folder, pt would like to be faxed after filled out, pt can be reached at [phone]

## 2018-01-31 NOTE — Telephone Encounter (Signed)
Done

## 2018-01-31 NOTE — Telephone Encounter (Signed)
FFO, copy/scan and fax please.

## 2018-08-02 ENCOUNTER — Other Ambulatory Visit: Payer: Self-pay | Admitting: Family Medicine

## 2018-08-02 DIAGNOSIS — Z1231 Encounter for screening mammogram for malignant neoplasm of breast: Secondary | ICD-10-CM

## 2018-08-31 ENCOUNTER — Other Ambulatory Visit: Payer: Self-pay

## 2018-08-31 ENCOUNTER — Ambulatory Visit
Admission: RE | Admit: 2018-08-31 | Discharge: 2018-08-31 | Disposition: A | Discharge status: Home / Self Care | Payer: Commercial Managed Care - PPO | Source: Ambulatory Visit | Attending: Family Medicine | Admitting: Family Medicine

## 2018-08-31 DIAGNOSIS — Z1231 Encounter for screening mammogram for malignant neoplasm of breast: Secondary | ICD-10-CM | POA: Diagnosis not present

## 2018-09-01 ENCOUNTER — Other Ambulatory Visit: Payer: Self-pay | Admitting: Family Medicine

## 2018-09-01 DIAGNOSIS — R928 Other abnormal and inconclusive findings on diagnostic imaging of breast: Secondary | ICD-10-CM

## 2018-09-06 ENCOUNTER — Other Ambulatory Visit: Payer: Self-pay

## 2018-09-06 ENCOUNTER — Ambulatory Visit
Admission: RE | Admit: 2018-09-06 | Discharge: 2018-09-06 | Disposition: A | Discharge status: Home / Self Care | Payer: Commercial Managed Care - PPO | Source: Ambulatory Visit | Attending: Family Medicine | Admitting: Family Medicine

## 2018-09-06 DIAGNOSIS — R928 Other abnormal and inconclusive findings on diagnostic imaging of breast: Secondary | ICD-10-CM

## 2018-09-06 DIAGNOSIS — R922 Inconclusive mammogram: Secondary | ICD-10-CM | POA: Diagnosis not present

## 2018-09-06 DIAGNOSIS — N6001 Solitary cyst of right breast: Secondary | ICD-10-CM | POA: Diagnosis not present

## 2018-09-08 DIAGNOSIS — D2261 Melanocytic nevi of right upper limb, including shoulder: Secondary | ICD-10-CM | POA: Diagnosis not present

## 2018-09-08 DIAGNOSIS — D485 Neoplasm of uncertain behavior of skin: Secondary | ICD-10-CM | POA: Diagnosis not present

## 2018-09-08 DIAGNOSIS — D225 Melanocytic nevi of trunk: Secondary | ICD-10-CM | POA: Diagnosis not present

## 2018-09-08 DIAGNOSIS — D2262 Melanocytic nevi of left upper limb, including shoulder: Secondary | ICD-10-CM | POA: Diagnosis not present

## 2018-09-08 DIAGNOSIS — L989 Disorder of the skin and subcutaneous tissue, unspecified: Secondary | ICD-10-CM | POA: Diagnosis not present

## 2018-12-08 ENCOUNTER — Encounter: Payer: Self-pay | Admitting: Family Medicine

## 2019-02-19 NOTE — Progress Notes (Signed)
Chief Complaint  Patient presents with  . Palpitations    and chest discomfort that has been going on for a few weeks. Would also like to have her cholesterol checked today.    Patient reports left sided chest discomfort, described as a dull throbbing.  Sometimes it is noted at the lateral left chest wall, described as achey.  Occurs at rest, driving, and with exercising. Denies positional change. Sometimes occurs with exercise, but actually has resolved while continuing to exercise. Once last week she stopped her exercise due to the pain, waiting to get checked out here.  Denies heartburn/reflux symptoms. No associated nausea or jaw pain, no arm pain. No associated shortness of breath.  She has palpitations mainly at night--either pounding, skipped beats, not usually fast, just hard. +anxiety, worried about this pain due to family history of heart disease, causing her to be very stressed. She has been having some trouble falling asleep. No other stressors, just worried about her health.  She was last seen by me at her CPE 10/2017, not scheduled for one this year. Previously expressed concern that her pulse is 170-180 when exercising at the gym.  She feels fine--not short of breath, able to speak.  No changes in her pulse with exercise. She hasn't been able to go to the gym related to COVID, but is exercising 4-5x/week, cardio and strength YouTube videos.   Irregular periods--had one 03/2017, then didn't have another until 03/2018; had another 05/2018, and none since. +hot flashes and night sweats, tolerable. No significant vaginal dryness or dyspareunia.  Vitamin D deficiency: Level was low at 26 in 2014. It was was normal on last check in 01/2016 (38) when taking a MVI gummy daily, as well as 1000 IU of Vitamin D daily. She currently still takes both supplements.  Hyperlipidemia:She has had her cholesterol be at goal with dietary changes, though on her last check 1 year ago, lipids  were back to being high.  She was counseled on cutting back on cheese and egg yolks (prior to last check she had been on vacation).  +FH heart disease. She has worked hard on limiting saturated fats and sugar.  She eats oatmeal daily, lots of salads, beans. She has some cheese, but cut back. She uses mainly vinaigrette dressings. No longer eating boiled eggs regularly.  Infrequent red meat. Has had more salt in her diet, soups, etc lately.   Lab Results  Component Value Date   CHOL 279 (H) 10/20/2017   HDL 78 10/20/2017   LDLCALC 186 (H) 10/20/2017   TRIG 76 10/20/2017   CHOLHDL 3.6 10/20/2017   PMH, PSH, SH reviewed  Outpatient Encounter Medications as of 02/20/2019  Medication Sig Note  . calcium carbonate (TUMS - DOSED IN MG ELEMENTAL CALCIUM) 500 MG chewable tablet Chew 1 tablet by mouth 2 (two) times daily. 02/12/2016: Takes 1 most day, for calcium supplementation (not heartburn)  . cholecalciferol (VITAMIN D) 1000 units tablet Take 1,000 Units by mouth daily.   . Multiple Vitamins-Minerals (MULTIVITAMIN WITH MINERALS) tablet Take 1 tablet by mouth daily.   . [DISCONTINUED] mupirocin ointment (BACTROBAN) 2 %     No facility-administered encounter medications on file as of 02/20/2019.    No Known Allergies  ROS: no fever, chills, headaches, dizziness, URI symptoms, cough, shortness of breath.  Chest pain/palpitations per HPI.  +anxiety per HPI. Hair loss over the last two years, thinning, no alopecia. No fatigue or change in weight, bowels, skin.  See HPI  PHYSICAL  EXAM:  BP 140/86   Pulse 80   Temp (!) 96.8 F (36 C) (Other (Comment))   Ht 5\' 8"  (1.727 m)   Wt 145 lb 6.4 oz (66 kg)   LMP 06/08/2018 (Approximate)   BMI 22.11 kg/m   Wt Readings from Last 3 Encounters:  02/20/19 145 lb 6.4 oz (66 kg)  10/20/17 145 lb (65.8 kg)  02/12/16 149 lb 12.8 oz (67.9 kg)   Well-appearing, slightly anxious female in no distress Repeat BP was higher (0000000 systolic) HEENT: conjunctiva  and sclera are clear, EOMI.  Wearing a mask due to COVID-19 Neck: no lymphadenopathy, thyromegaly or carotid bruit Heart: regular rate and rhythm, no murmur.  No ectopy. Chest: Tender at multiple levels L>R costochondral junctions.  No pain with deep breath Back: no spinal or CVA tenderness Abdomen: soft, nontender, no organomegaly or mass Extremities: no edema, normal pulses Psych: somwhat anxious.  Full range of affect, normal eye contact, speech, grooming.    EKG: sinus brady (rate of 54), no acute changes, no PVC's or PAC's noted.   ASSESSMENT/PLAN  Palpitations - Plan: TSH, CBC with Differential/Platelet, Comprehensive metabolic panel, EKG [id number]  Pure hypercholesterolemia - due for recheck; diet has improved - Plan: Lipid panel  Chest wall pain - some component of costochondritis--may not reproduce the discomfort she is having. Given that pain is at rest, and inconsistent with exertion, doubt cardiac  Vitamin D deficiency - continue supplements  Need for influenza vaccination - Plan: Flu Vaccine QUAD 6+ mos PF IM (Fluarix Quad PF)  Elevated BP without diagnosis of hypertension - suspect related to anxiety (was higher on recheck)--encouraged low Na diet, monitor elsewhere  Hair thinning - check thyroid test; hair/skin/nail viitamin. DDx reviewed  Discussed Ddx chest pain--EKG reassuring, as is history.  Recommended heat and NSAIDs to treat chest wall tenderness.  If pain persists, may need further evaluation.  Discussed potential false positives in stress tests if low probability based on history, so don't recommend it quite yet. Discussed risk factors for heart disease--elevated blood pressure and cholesterol.  Suspect lipids will be improved on repeat, as diet improved. Discussed low Na diet, effect of anxiety on BP, and to check BP elsewhere, if able.  Can definitely cut out some salt in her diet.  F/u for CPE, next available. Consider f/u in 1-2 weeks if symptoms persist  or worsen, or don't completely resolve.

## 2019-02-20 ENCOUNTER — Other Ambulatory Visit: Payer: Self-pay

## 2019-02-20 ENCOUNTER — Encounter: Payer: Self-pay | Admitting: Family Medicine

## 2019-02-20 ENCOUNTER — Ambulatory Visit: Payer: Commercial Managed Care - PPO | Admitting: Family Medicine

## 2019-02-20 VITALS — BP 140/86 | HR 80 | Temp 96.8°F | Ht 68.0 in | Wt 145.4 lb

## 2019-02-20 DIAGNOSIS — R0789 Other chest pain: Secondary | ICD-10-CM | POA: Diagnosis not present

## 2019-02-20 DIAGNOSIS — Z23 Encounter for immunization: Secondary | ICD-10-CM | POA: Diagnosis not present

## 2019-02-20 DIAGNOSIS — R002 Palpitations: Secondary | ICD-10-CM | POA: Diagnosis not present

## 2019-02-20 DIAGNOSIS — L659 Nonscarring hair loss, unspecified: Secondary | ICD-10-CM

## 2019-02-20 DIAGNOSIS — E7800 Pure hypercholesterolemia, unspecified: Secondary | ICD-10-CM | POA: Diagnosis not present

## 2019-02-20 DIAGNOSIS — E559 Vitamin D deficiency, unspecified: Secondary | ICD-10-CM | POA: Diagnosis not present

## 2019-02-20 DIAGNOSIS — R030 Elevated blood-pressure reading, without diagnosis of hypertension: Secondary | ICD-10-CM

## 2019-02-20 NOTE — Patient Instructions (Addendum)
Hair/skin/nail vitamins may be helpful.  There are many causes to hair thinning, including stress, hormonal changes (menopause). We will check your thyroid today to be sure that isn't contributing.  You are tender over the costochondral junctions at your chest (musculoskeletal cause of chest pain).  Moist heat and anti-inflammatories (e advil, aleve) can help.  Your blood pressure was elevated today.  This can be contributed by anxiety, but also from a lot of salt in your diet.  Please read info below about cutting back on salt in your diet (limiting canned foods, reading labels, etc). If possible, try and check your blood pressure elsewhere to ensure that it isn't really high (goal is under 130/80, up to 135/85 is borderline)   Low-Sodium Eating Plan Sodium, which is an element that makes up salt, helps you maintain a healthy balance of fluids in your body. Too much sodium can increase your blood pressure and cause fluid and waste to be held in your body. Your health care provider or dietitian may recommend following this plan if you have high blood pressure (hypertension), kidney disease, liver disease, or heart failure. Eating less sodium can help lower your blood pressure, reduce swelling, and protect your heart, liver, and kidneys. What are tips for following this plan? General guidelines  Most people on this plan should limit their sodium intake to 1,500-2,000 mg (milligrams) of sodium each day. Reading food labels   The Nutrition Facts label lists the amount of sodium in one serving of the food. If you eat more than one serving, you must multiply the listed amount of sodium by the number of servings.  Choose foods with less than 140 mg of sodium per serving.  Avoid foods with 300 mg of sodium or more per serving. Shopping  Look for lower-sodium products, often labeled as "low-sodium" or "no salt added."  Always check the sodium content even if foods are labeled as "unsalted" or "no  salt added".  Buy fresh foods. ? Avoid canned foods and premade or frozen meals. ? Avoid canned, cured, or processed meats  Buy breads that have less than 80 mg of sodium per slice. Cooking  Eat more home-cooked food and less restaurant, buffet, and fast food.  Avoid adding salt when cooking. Use salt-free seasonings or herbs instead of table salt or sea salt. Check with your health care provider or pharmacist before using salt substitutes.  Cook with plant-based oils, such as canola, sunflower, or olive oil. Meal planning  When eating at a restaurant, ask that your food be prepared with less salt or no salt, if possible.  Avoid foods that contain MSG (monosodium glutamate). MSG is sometimes added to [place] food, bouillon, and some canned foods. What foods are recommended? The items listed may not be a complete list. Talk with your dietitian about what dietary choices are best for you. Grains Low-sodium cereals, including oats, puffed wheat and rice, and shredded wheat. Low-sodium crackers. Unsalted rice. Unsalted pasta. Low-sodium bread. Whole-grain breads and whole-grain pasta. Vegetables Fresh or frozen vegetables. "No salt added" canned vegetables. "No salt added" tomato sauce and paste. Low-sodium or reduced-sodium tomato and vegetable juice. Fruits Fresh, frozen, or canned fruit. Fruit juice. Meats and other protein foods Fresh or frozen (no salt added) meat, poultry, seafood, and fish. Low-sodium canned tuna and salmon. Unsalted nuts. Dried peas, beans, and lentils without added salt. Unsalted canned beans. Eggs. Unsalted nut butters. Dairy Milk. Soy milk. Cheese that is naturally low in sodium, such as ricotta cheese,  fresh mozzarella, or Swiss cheese Low-sodium or reduced-sodium cheese. Cream cheese. Yogurt. Fats and oils Unsalted butter. Unsalted margarine with no trans fat. Vegetable oils such as canola or olive oils. Seasonings and other foods Fresh and dried herbs and  spices. Salt-free seasonings. Low-sodium mustard and ketchup. Sodium-free salad dressing. Sodium-free light mayonnaise. Fresh or refrigerated horseradish. Lemon juice. Vinegar. Homemade, reduced-sodium, or low-sodium soups. Unsalted popcorn and pretzels. Low-salt or salt-free chips. What foods are not recommended? The items listed may not be a complete list. Talk with your dietitian about what dietary choices are best for you. Grains Instant hot cereals. Bread stuffing, pancake, and biscuit mixes. Croutons. Seasoned rice or pasta mixes. Noodle soup cups. Boxed or frozen macaroni and cheese. Regular salted crackers. Self-rising flour. Vegetables Sauerkraut, pickled vegetables, and relishes. Olives. [place] fries. Onion rings. Regular canned vegetables (not low-sodium or reduced-sodium). Regular canned tomato sauce and paste (not low-sodium or reduced-sodium). Regular tomato and vegetable juice (not low-sodium or reduced-sodium). Frozen vegetables in sauces. Meats and other protein foods Meat or fish that is salted, canned, smoked, spiced, or pickled. Bacon, ham, sausage, hotdogs, corned beef, chipped beef, packaged lunch meats, salt pork, jerky, pickled herring, anchovies, regular canned tuna, sardines, salted nuts. Dairy Processed cheese and cheese spreads. Cheese curds. Blue cheese. Feta cheese. String cheese. Regular cottage cheese. Buttermilk. Canned milk. Fats and oils Salted butter. Regular margarine. Ghee. Bacon fat. Seasonings and other foods Onion salt, garlic salt, seasoned salt, table salt, and sea salt. Canned and packaged gravies. Worcestershire sauce. Tartar sauce. Barbecue sauce. Teriyaki sauce. Soy sauce, including reduced-sodium. Steak sauce. Fish sauce. Oyster sauce. Cocktail sauce. Horseradish that you find on the shelf. Regular ketchup and mustard. Meat flavorings and tenderizers. Bouillon cubes. Hot sauce and Tabasco sauce. Premade or packaged marinades. Premade or packaged taco  seasonings. Relishes. Regular salad dressings. Salsa. Potato and tortilla chips. Corn chips and puffs. Salted popcorn and pretzels. Canned or dried soups. Pizza. Frozen entrees and pot pies. Summary  Eating less sodium can help lower your blood pressure, reduce swelling, and protect your heart, liver, and kidneys.  Most people on this plan should limit their sodium intake to 1,500-2,000 mg (milligrams) of sodium each day.  Canned, boxed, and frozen foods are high in sodium. Restaurant foods, fast foods, and pizza are also very high in sodium. You also get sodium by adding salt to food.  Try to cook at home, eat more fresh fruits and vegetables, and eat less fast food, canned, processed, or prepared foods. This information is not intended to replace advice given to you by your health care provider. Make sure you discuss any questions you have with your health care provider. Document Released: 11/28/2001 Document Revised: 05/21/2017 Document Reviewed: 06/01/2016 Elsevier Patient Education  2020 [employer].    Costochondritis  Costochondritis is swelling and irritation (inflammation) of the tissue (cartilage) that connects your ribs to your breastbone (sternum). This causes pain in the front of your chest. The pain usually starts gradually and involves more than one rib. What are the causes? The exact cause of this condition is not always known. It results from stress on the cartilage where your ribs attach to your sternum. The cause of this stress could be:  Chest injury (trauma).  Exercise or activity, such as lifting.  Severe coughing. What increases the risk? You may be at higher risk for this condition if you:  Are female.  Are 50?48 years old.  Recently started a new exercise or work activity.  Have low levels of vitamin D.  Have a condition that makes you cough frequently. What are the signs or symptoms? The main symptom of this condition is chest pain. The  pain:  Usually starts gradually and can be sharp or dull.  Gets worse with deep breathing, coughing, or exercise.  Gets better with rest.  May be worse when you press on the sternum-rib connection (tenderness). How is this diagnosed? This condition is diagnosed based on your symptoms, medical history, and a physical exam. Your health care provider will check for tenderness when pressing on your sternum. This is the most important finding. You may also have tests to rule out other causes of chest pain. These may include:  A chest X-ray to check for lung problems.  An electrocardiogram (ECG) to see if you have a heart problem that could be causing the pain.  An imaging scan to rule out a chest or rib fracture. How is this treated? This condition usually goes away on its own over time. Your health care provider may prescribe an NSAID to reduce pain and inflammation. Your health care provider may also suggest that you:  Rest and avoid activities that make pain worse.  Apply heat or cold to the area to reduce pain and inflammation.  Do exercises to stretch your chest muscles. If these treatments do not help, your health care provider may inject a numbing medicine at the sternum-rib connection to help relieve the pain. Follow these instructions at home:  Avoid activities that make pain worse. This includes any activities that use chest, abdominal, and side muscles.  If directed, put ice on the painful area: ? Put ice in a plastic bag. ? Place a towel between your skin and the bag. ? Leave the ice on for 20 minutes, 2-3 times a day.  If directed, apply heat to the affected area as often as told by your health care provider. Use the heat source that your health care provider recommends, such as a moist heat pack or a heating pad. ? Place a towel between your skin and the heat source. ? Leave the heat on for 20-30 minutes. ? Remove the heat if your skin turns bright red. This is  especially important if you are unable to feel pain, heat, or cold. You may have a greater risk of getting burned.  Take over-the-counter and prescription medicines only as told by your health care provider.  Return to your normal activities as told by your health care provider. Ask your health care provider what activities are safe for you.  Keep all follow-up visits as told by your health care provider. This is important. Contact a health care provider if:  You have chills or a fever.  Your pain does not go away or it gets worse.  You have a cough that does not go away (is persistent). Get help right away if:  You have shortness of breath. This information is not intended to replace advice given to you by your health care provider. Make sure you discuss any questions you have with your health care provider. Document Released: 03/18/2005 Document Revised: 06/23/2017 Document Reviewed: 10/02/2015 Elsevier Patient Education  2020 [employer].

## 2019-02-21 LAB — COMPREHENSIVE METABOLIC PANEL
ALT: 10 IU/L (ref 0–32)
AST: 19 IU/L (ref 0–40)
Albumin/Globulin Ratio: 2.1 (ref 1.2–2.2)
Albumin: 4.9 g/dL — ABNORMAL HIGH (ref 3.8–4.8)
Alkaline Phosphatase: 65 IU/L (ref 39–117)
BUN/Creatinine Ratio: 17 (ref 9–23)
BUN: 13 mg/dL (ref 6–24)
Bilirubin Total: 0.3 mg/dL (ref 0.0–1.2)
CO2: 24 mmol/L (ref 20–29)
Calcium: 9.3 mg/dL (ref 8.7–10.2)
Chloride: 101 mmol/L (ref 96–106)
Creatinine, Ser: 0.76 mg/dL (ref 0.57–1.00)
GFR calc Af Amer: 107 mL/min/{1.73_m2} (ref >59)
GFR calc non Af Amer: 93 mL/min/{1.73_m2} (ref >59)
Globulin, Total: 2.3 g/dL (ref 1.5–4.5)
Glucose: 84 mg/dL (ref 65–99)
Potassium: 4.4 mmol/L (ref 3.5–5.2)
Sodium: 142 mmol/L (ref 134–144)
Total Protein: 7.2 g/dL (ref 6.0–8.5)

## 2019-02-21 LAB — LIPID PANEL
Chol/HDL Ratio: 3.3 ratio (ref 0.0–4.4)
Cholesterol, Total: 221 mg/dL — ABNORMAL HIGH (ref 100–199)
HDL: 67 mg/dL (ref >39)
LDL Chol Calc (NIH): 137 mg/dL — ABNORMAL HIGH (ref 0–99)
Triglycerides: 95 mg/dL (ref 0–149)
VLDL Cholesterol Cal: 17 mg/dL (ref 5–40)

## 2019-02-21 LAB — CBC WITH DIFFERENTIAL/PLATELET
Basophils Absolute: 0.1 10*3/uL (ref 0.0–0.2)
Basos: 1 %
EOS (ABSOLUTE): 0.1 10*3/uL (ref 0.0–0.4)
Eos: 1 %
Hematocrit: 40.7 % (ref 34.0–46.6)
Hemoglobin: 13.6 g/dL (ref 11.1–15.9)
Immature Grans (Abs): 0 10*3/uL (ref 0.0–0.1)
Immature Granulocytes: 0 %
Lymphocytes Absolute: 1.8 10*3/uL (ref 0.7–3.1)
Lymphs: 36 %
MCH: 29.3 pg (ref 26.6–33.0)
MCHC: 33.4 g/dL (ref 31.5–35.7)
MCV: 88 fL (ref 79–97)
Monocytes Absolute: 0.3 10*3/uL (ref 0.1–0.9)
Monocytes: 7 %
Neutrophils Absolute: 2.8 10*3/uL (ref 1.4–7.0)
Neutrophils: 55 %
Platelets: 257 10*3/uL (ref 150–450)
RBC: 4.64 x10E6/uL (ref 3.77–5.28)
RDW: 11.7 % (ref 11.7–15.4)
WBC: 5 10*3/uL (ref 3.4–10.8)

## 2019-02-21 LAB — TSH: TSH: 1.65 u[IU]/mL (ref 0.450–4.500)

## 2019-02-28 ENCOUNTER — Telehealth: Payer: Self-pay | Admitting: Family Medicine

## 2019-02-28 DIAGNOSIS — R079 Chest pain, unspecified: Secondary | ICD-10-CM

## 2019-02-28 DIAGNOSIS — R002 Palpitations: Secondary | ICD-10-CM

## 2019-02-28 NOTE — Telephone Encounter (Signed)
Patient advised.

## 2019-02-28 NOTE — Telephone Encounter (Signed)
[place] for referral to cardiologist, order entered. Please advise patient

## 2019-02-28 NOTE — Telephone Encounter (Signed)
Pt states had bad night Sunday with palpitations and light headed, sensation shooting up in head, couldn't sleep.  Still having same symptoms day & night including chest discomfort, more predominately when she is exercising as she says she had told you at last office visit.  Wants to know if you want to see her again or she would like to see a cardiologist for further work up.  No current chest pain.  I did offer her appointment for today but she had rather me send you a message and see what you recommend first.

## 2019-03-08 NOTE — Patient Instructions (Addendum)
  HEALTH MAINTENANCE RECOMMENDATIONS:  It is recommended that you get at least 30 minutes of aerobic exercise at least 5 days/week (for weight loss, you may need as much as 60-90 minutes). This can be any activity that gets your heart rate up. This can be divided in 10-15 minute intervals if needed, but try and build up your endurance at least once a week.  Weight bearing exercise is also recommended twice weekly.  Eat a healthy diet with lots of vegetables, fruits and fiber.  "Colorful" foods have a lot of vitamins (ie green vegetables, tomatoes, red peppers, etc).  Limit sweet tea, regular sodas and alcoholic beverages, all of which has a lot of calories and sugar.  Up to 1 alcoholic drink daily may be beneficial for women (unless trying to lose weight, watch sugars).  Drink a lot of water.  Calcium recommendations are 1200-1500 mg daily (1500 mg for postmenopausal women or women without ovaries), and vitamin D 1000 IU daily.  This should be obtained from diet and/or supplements (vitamins), and calcium should not be taken all at once, but in divided doses.  Monthly self breast exams and yearly mammograms for women over the age of 36 is recommended.  Sunscreen of at least SPF 30 should be used on all sun-exposed parts of the skin when outside between the hours of 10 am and 4 pm (not just when at beach or pool, but even with exercise, golf, tennis, and yard work!)  Use a sunscreen that says "broad spectrum" so it covers both UVA and UVB rays, and make sure to reapply every 1-2 hours.  Remember to change the batteries in your smoke detectors when changing your clock times in the spring and fall. Carbon monoxide detectors are recommended for your home.  Use your seat belt every time you are in a car, and please drive safely and not be distracted with cell phones and texting while driving.  Bring your blood pressure monitor with you when you see Dr. Meda Coffee next week. Your cholesterol was much  better--keep up the healthy diet!

## 2019-03-08 NOTE — Progress Notes (Signed)
Chief Complaint  Patient presents with  . Annual Exam    fasting annual exam with pap. No new concerns.     Martha Barton is a 48 y.o. female who presents for a complete physical.  She has the following concerns:  She was seen a few weeks ago with complaints of left sided chest pain, palpitations. Labs were done (see below).  She called back on 9/8 stating that she was feeling worse re: palpitations and light headed, sensation shooting up in head, couldn't sleep.  Still having same symptoms day & night including chest discomfort, more predominately when she is exercising. She was referred to cardiologist, and is scheduled to see Dr. Meda Coffee 9/21. She got a blood pressure monitor and BP's at home have been 114-132/80-93.  Once was 142/94, repeat was 139/96.  She recalls that her heart was pounding at that time (not fast).  Pulse runs between 55 and 80.  She can go a few days fine, but then symptoms recur--more the heart pounding; the chest discomfort is now very mild, intermittent.  Usually occurred with exercise.  She stopped the high intensity exercise, has still been walking. She did a pump class yesterday and is sore all over.  Irregular periods--had one 03/2017, then didn't have another until 03/2018; had another 05/2018, and no,ne since. +hot flashes and night sweats, tolerable somewhat better..No significant vaginal dryness or dyspareunia.  Vitamin D deficiency: Level was low at 26 in 2014.It was was normal on last check in 01/2016 (38) when takinga MVI gummy daily, as well as 1000 IU of Vitamin D daily. She currently still takes both supplements.  Hyperlipidemia:She has had her cholesterol be at goal with dietary changes, though on her last check 1 year ago, lipids were back to being high.  She was counseled on cutting back on cheese and egg yolks (prior to last check she had been on vacation).  +FH heart disease. She has worked hard on limiting saturated fats and sugar.  She eats  oatmeal daily, lots of salads, beans. She has some cheese, but cut back. She uses mainly vinaigrette dressings. No longer eating boiled eggs regularly.  Infrequent red meat. See recent lipids, below. Significantly improved since last check.  Immunization History  Administered Date(s) Administered  . Influenza Split 05/08/2009, 05/09/2010  . Influenza,inj,Quad PF,6+ Mos 02/12/2016, 02/20/2019  . Td 06/22/2005  . Tdap 02/12/2016   Last Pap smear:01/2016 normal, no high risk HPV detected Last mammogram:08/2018 (required [place] and spot compression, showed R breast cyst) Last colonoscopy: never Last DEXA: never Dentist: twice yearly Ophtho: every 1-2 years, went recently Exercise: Walks 4.3 miles most days.  Prior routine had been 4-5 x/week, higher intensity (cut back recently since palpitations). Recently restarted weight-bearing exercise.  Past Medical History:  Diagnosis Date  . Hypercholesterolemia   . Irregular menses   . Menstrual headache   . Unspecified vitamin D deficiency     Past Surgical History:  Procedure Laterality Date  . APPENDECTOMY      Social History   Socioeconomic History  . Marital status: Married    Spouse name: Not on file  . Number of children: Not on file  . Years of education: Not on file  . Highest education level: Not on file  Occupational History  . Occupation: homemaker  Social Needs  . Financial resource strain: Not on file  . Food insecurity    Worry: Not on file    Inability: Not on file  . Transportation needs  Medical: Not on file    Non-medical: Not on file  Tobacco Use  . Smoking status: Never Smoker  . Smokeless tobacco: Never Used  Substance and Sexual Activity  . Alcohol use: Yes    Comment: very rare  . Drug use: No  . Sexual activity: Yes    Partners: Male    Birth control/protection: Condom  Lifestyle  . Physical activity    Days per week: Not on file    Minutes per session: Not on file  . Stress: Not on file   Relationships  . Social Herbalist on phone: Not on file    Gets together: Not on file    Attends religious service: Not on file    Active member of club or organization: Not on file    Attends meetings of clubs or organizations: Not on file    Relationship status: Not on file  . Intimate partner violence    Fear of current or ex partner: Not on file    Emotionally abused: Not on file    Physically abused: Not on file    Forced sexual activity: Not on file  Other Topics Concern  . Not on file  Social History Narrative   Agricultural engineer (former Pharmacist, hospital).  Lives with husband and 2 children (65 yo son, 7 yo daughter 02/2019), 1 dog.    Family History  Problem Relation Age of Onset  . Allergies Mother   . Hyperlipidemia Mother        elevated cholesterol  . Osteoporosis Mother   . Hypertension Father   . Diabetes Father   . Coronary artery disease Father        CABG (late 64's)  . Colon cancer Father 2  . Cancer Father   . Osteoporosis Maternal Grandmother   . Cancer Maternal Grandmother        skin cancer  . Heart disease Maternal Grandfather   . Cancer Paternal Grandfather        melanoma  . Heart disease Paternal Grandfather   . Breast cancer Maternal Aunt 74  . Coronary artery disease Maternal Aunt     Outpatient Encounter Medications as of 03/09/2019  Medication Sig Note  . calcium carbonate (TUMS - DOSED IN MG ELEMENTAL CALCIUM) 500 MG chewable tablet Chew 1 tablet by mouth 2 (two) times daily. 02/12/2016: Takes 1 most day, for calcium supplementation (not heartburn)  . cholecalciferol (VITAMIN D) 1000 units tablet Take 1,000 Units by mouth daily.   . Multiple Vitamins-Minerals (MULTIVITAMIN WITH MINERALS) tablet Take 1 tablet by mouth daily.    No facility-administered encounter medications on file as of 03/09/2019.     No Known Allergies   ROS: The patient denies anorexia, fever, vision changes, decreased hearing, ear pain, sore throat, breast concerns,  syncope, dyspnea on exertion, cough, swelling, nausea, vomiting, diarrhea, constipation, abdominal pain, melena, hematochezia, indigestion/heartburn, hematuria, incontinence, dysuria, genital lesions, joint pains, numbness, tingling, weakness, tremor, depression, anxiety, abnormal bleeding/bruising, or enlarged lymph nodes. Denies changes in skin/bowels/moods/energy Noted some hair loss the last couple of years, gradual thinning. No patches of alopecia. Hot flashes, night sweats, comes in waves, not bothersome currently. Irregular cycles, last was 05/2018. Chest pain, palpitations, per HPI  PHYSICAL EXAM:  BP (!) 140/96   Pulse 80   Temp (!) 96.8 F (36 C) (Other (Comment))   Ht 5\' 7"  (1.702 m)   Wt 141 lb (64 kg)   LMP 05/23/2018   BMI 22.08 kg/m  Repeat BP 123456 systolic; difficult to hear the diastolic (got very faint after around 100, heard one beat at 82; difficult to hear on recheck as well).  Wt Readings from Last 3 Encounters:  03/09/19 141 lb (64 kg)  02/20/19 145 lb 6.4 oz (66 kg)  10/20/17 145 lb (65.8 kg)   BP Readings from Last 3 Encounters:  03/09/19 (!) 140/96  02/20/19 140/86  10/20/17 118/80   General Appearance:  Alert, cooperative, no distress, appears stated age  Head:  Normocephalic, without obvious abnormality, atraumatic  Eyes:  PERRL, conjunctiva/corneas clear, EOM's intact, fundi benign  Ears:  Normal TM's and external ear canals  Nose: Not examined, wearing mask due to COVID-19 pandemic  Throat: Not examined, wearing mask due to COVID-19 pandemic  Neck: Supple, no lymphadenopathy; thyroid: no enlargement/tenderness/ nodules; no carotid bruit or JVD  Back:  Spine nontender, no curvature, ROM normal, no CVAtenderness  Lungs:  Clear to auscultation bilaterally without wheezes, rales orronchi; respirations unlabored  Chest Wall:  Mildly tender at costochondral junctions bilaterally  Heart:  Regular rate and rhythm, S1 and S2  normal, no murmur, rub or gallop  Breast Exam:  No tenderness, masses, or nipple discharge or inversion. No axillary lymphadenopathy.   Abdomen:  Soft, non-tender, nondistended, normoactive bowel sounds,  no masses, no hepatosplenomegaly  Genitalia:  Normal external genitalia without lesions. BUS and vagina normal;  No cervical motion tenderness. Uterus and adnexa not enlarged, nontender, no masses. Pap not performed  Rectal:  Normal sphincter tone, no mass. Heme negative stool.  Extremities: No clubbing, cyanosis or edema  Pulses: 2+ and symmetric all extremities  Skin: Skin color, texture, turgor normal, no rashes.   Lymph nodes: Cervical, supraclavicular, and axillary nodes normal  Neurologic: CNII-XII intact, normal strength, sensation and gait; reflexes 2+ and symmetric throughout  Psych: Normal mood, affect, hygiene and grooming   Lab Results  Component Value Date   CHOL 221 (H) 02/20/2019   HDL 67 02/20/2019   LDLCALC 186 (H) 10/20/2017   TRIG 95 02/20/2019   CHOLHDL 3.3 02/20/2019   LDL 137     Chemistry      Component Value Date/Time   NA 142 02/20/2019 1314   K 4.4 02/20/2019 1314   CL 101 02/20/2019 1314   CO2 24 02/20/2019 1314   BUN 13 02/20/2019 1314   CREATININE 0.76 02/20/2019 1314   CREATININE 0.72 02/12/2016 0955      Component Value Date/Time   CALCIUM 9.3 02/20/2019 1314   ALKPHOS 65 02/20/2019 1314   AST 19 02/20/2019 1314   ALT 10 02/20/2019 1314   BILITOT 0.3 02/20/2019 1314     Lab Results  Component Value Date   WBC 5.0 02/20/2019   HGB 13.6 02/20/2019   HCT 40.7 02/20/2019   MCV 88 02/20/2019   PLT 257 02/20/2019   Lab Results  Component Value Date   TSH 1.650 02/20/2019    ASSESSMENT/PLAN:  Annual physical exam  Pure hypercholesterolemia - improved with dietary changes, chol/HDL ratio good. ASCVD low risk. Cont lowfat, low cholesterol diet  Family history of colon cancer in father -  Plan: Ambulatory referral to Gastroenterology  Screen for colon cancer - Plan: Ambulatory referral to Gastroenterology  Chest pain, unspecified type - tender at chest wall, has some MSK component.  Her chest discomfort has significantly improved since last visit  Palpitations - has appt scheduled next week with cardiologist for further evaluation. To bring BP monitor to assess accuracy  May need  monitor and/or echo, will leave up to cardiologist for further evaluation of her palpitations and discomfort.  Discussed monthly self breast exams and yearly mammograms; at least 30 minutes of aerobic activity at least 5 days/week, weight-bearing exercise at least 2x/week; proper sunscreen use reviewed; healthy diet, including goals of calcium and vitamin D intake and alcohol recommendations (less than or equal to 1 drink/day) reviewed; regular seatbelt use; changing batteries in smoke detectors. Immunization recommendations--UTD. Continue yearly flu shots; Shingrix age 18.Colonoscopy recommendations reviewed, now, if covered by her insurance; otherwise can wait until covered/age 32. She now also has FHx of cancer in her father.    F/u 1 year, sooner prn.

## 2019-03-09 ENCOUNTER — Other Ambulatory Visit: Payer: Self-pay

## 2019-03-09 ENCOUNTER — Encounter: Payer: Self-pay | Admitting: Family Medicine

## 2019-03-09 ENCOUNTER — Ambulatory Visit: Payer: Commercial Managed Care - PPO | Admitting: Family Medicine

## 2019-03-09 VITALS — BP 140/96 | HR 80 | Temp 96.8°F | Ht 67.0 in | Wt 141.0 lb

## 2019-03-09 DIAGNOSIS — Z800 Family history of malignant neoplasm of digestive organs: Secondary | ICD-10-CM

## 2019-03-09 DIAGNOSIS — Z0000 Encounter for general adult medical examination without abnormal findings: Secondary | ICD-10-CM

## 2019-03-09 DIAGNOSIS — E7800 Pure hypercholesterolemia, unspecified: Secondary | ICD-10-CM

## 2019-03-09 DIAGNOSIS — R079 Chest pain, unspecified: Secondary | ICD-10-CM

## 2019-03-09 DIAGNOSIS — Z1211 Encounter for screening for malignant neoplasm of colon: Secondary | ICD-10-CM | POA: Diagnosis not present

## 2019-03-09 DIAGNOSIS — R002 Palpitations: Secondary | ICD-10-CM

## 2019-03-13 ENCOUNTER — Encounter: Payer: Self-pay | Admitting: Cardiology

## 2019-03-13 ENCOUNTER — Other Ambulatory Visit: Payer: Self-pay

## 2019-03-13 ENCOUNTER — Ambulatory Visit: Payer: Commercial Managed Care - PPO | Admitting: Cardiology

## 2019-03-13 VITALS — BP 142/90 | HR 90 | Ht 67.0 in | Wt 140.6 lb

## 2019-03-13 DIAGNOSIS — Z8249 Family history of ischemic heart disease and other diseases of the circulatory system: Secondary | ICD-10-CM

## 2019-03-13 DIAGNOSIS — R072 Precordial pain: Secondary | ICD-10-CM

## 2019-03-13 DIAGNOSIS — R002 Palpitations: Secondary | ICD-10-CM

## 2019-03-13 DIAGNOSIS — E785 Hyperlipidemia, unspecified: Secondary | ICD-10-CM

## 2019-03-13 MED ORDER — METOPROLOL TARTRATE 100 MG PO TABS: 100.0000 mg | ORAL_TABLET | Freq: Once | ORAL | 0 refills | Status: DC

## 2019-03-13 NOTE — Progress Notes (Signed)
Cardiology Office Note:    Date:  03/13/2019   ID:  Martha Barton, DOB 1970-09-22, MRN [id number]  PCP:  Rita Ohara, MD  Cardiologist:  No primary care provider on file.  Electrophysiologist:  None   Referring MD: Rita Ohara, MD   Chief complaint: Palpitations, chest pain  History of Present Illness:    Martha Barton is a 48 y.o. female with no prior cardiac history and no significant history in general however she has very strong family history of premature coronary artery disease, her father had first myocardial infarction in his 40s and bypass at age of 15.  Both of her grandfathers also underwent bypass surgery.  She has been recently diagnosed with hypertension on several occasions however have not been starting her medications.  Her LDL has always been elevated despite effort in her diet and exercise.  She exercises almost daily with high aerobic classes.  She has been starting noticing chest pains that are left-sided squeezing like present on exertion but also at rest.  She has also noticed palpitations mostly at night they are not necessarily fast but they were very strong and sometimes they wake her up from sleep.  No syncope no lower extremity edema.  Past Medical History:  Diagnosis Date  . Hypercholesterolemia   . Irregular menses   . Menstrual headache   . Pure hypercholesterolemia 09/02/2012   Lab Results  Component Value Date   CHOL 246* 09/01/2012   HDL 75 09/01/2012   LDLCALC 154* 09/01/2012   TRIG 87 09/01/2012   CHOLHDL 3.3 09/01/2012      . Unspecified vitamin D deficiency     Past Surgical History:  Procedure Laterality Date  . APPENDECTOMY      Current Medications: Current Meds  Medication Sig  . calcium carbonate (TUMS - DOSED IN MG ELEMENTAL CALCIUM) 500 MG chewable tablet Chew 1 tablet by mouth daily.   . cholecalciferol (VITAMIN D) 1000 units tablet Take 1,000 Units by mouth daily.  . Multiple Vitamins-Minerals (MULTIVITAMIN WITH MINERALS) tablet Take 1  tablet by mouth daily.     Allergies:   Patient has no known allergies.   Social History   Socioeconomic History  . Marital status: Married    Spouse name: Not on file  . Number of children: Not on file  . Years of education: Not on file  . Highest education level: Not on file  Occupational History  . Occupation: homemaker  Social Needs  . Financial resource strain: Not on file  . Food insecurity    Worry: Not on file    Inability: Not on file  . Transportation needs    Medical: Not on file    Non-medical: Not on file  Tobacco Use  . Smoking status: Never Smoker  . Smokeless tobacco: Never Used  Substance and Sexual Activity  . Alcohol use: Yes    Comment: very rare  . Drug use: No  . Sexual activity: Yes    Partners: Male    Birth control/protection: Condom  Lifestyle  . Physical activity    Days per week: Not on file    Minutes per session: Not on file  . Stress: Not on file  Relationships  . Social Herbalist on phone: Not on file    Gets together: Not on file    Attends religious service: Not on file    Active member of club or organization: Not on file    Attends meetings of clubs or  organizations: Not on file    Relationship status: Not on file  Other Topics Concern  . Not on file  Social History Narrative   Agricultural engineer (former Pharmacist, hospital).  Lives with husband and 2 children (50 yo son, 46 yo daughter 02/2019), 1 dog.     Family History: The patient's family history includes Allergies in her mother; Breast cancer (age of onset: 66) in her maternal aunt; Cancer in her father, maternal grandmother, and paternal grandfather; Colon cancer (age of onset: 61) in her father; Coronary artery disease in her father and maternal aunt; Diabetes in her father; Heart disease in her maternal grandfather and paternal grandfather; Hyperlipidemia in her mother; Hypertension in her father; Osteoporosis in her maternal grandmother and mother.  ROS:   Please see the  history of present illness.    All other systems reviewed and are negative.  EKGs/Labs/Other Studies Reviewed:    The following studies were reviewed today:  EKG:  EKG is ordered today.  The ekg ordered today demonstrates normal sinus rhythm, normal EKG, no prior available for comparison.  Recent Labs: 02/20/2019: ALT 10; BUN 13; Creatinine, Ser 0.76; Hemoglobin 13.6; Platelets 257; Potassium 4.4; Sodium 142; TSH 1.650  Recent Lipid Panel    Component Value Date/Time   CHOL 221 (H) 02/20/2019 1314   TRIG 95 02/20/2019 1314   HDL 67 02/20/2019 1314   CHOLHDL 3.3 02/20/2019 1314   CHOLHDL 2.9 02/12/2016 0955   VLDL 17 02/12/2016 0955   LDLCALC 186 (H) 10/20/2017 1530    Physical Exam:    VS:  BP (!) 142/90   Pulse 90   Ht 5\' 7"  (1.702 m)   Wt 140 lb 9.6 oz (63.8 kg)   SpO2 99%   BMI 22.02 kg/m     Wt Readings from Last 3 Encounters:  03/13/19 140 lb 9.6 oz (63.8 kg)  03/09/19 141 lb (64 kg)  02/20/19 145 lb 6.4 oz (66 kg)    GEN:  Well nourished, well developed in no acute distress HEENT: Normal NECK: No JVD; No carotid bruits LYMPHATICS: No lymphadenopathy CARDIAC: RRR, no murmurs, rubs, gallops RESPIRATORY:  Clear to auscultation without rales, wheezing or rhonchi  ABDOMEN: Soft, non-tender, non-distended MUSCULOSKELETAL:  No edema; No deformity  SKIN: Warm and dry NEUROLOGIC:  Alert and oriented x 3 PSYCHIATRIC:  Normal affect   ASSESSMENT:    1. Palpitations   2. Precordial pain   3. Family history of early CAD   64. Hyperlipidemia, unspecified hyperlipidemia type    PLAN:    In order of problems listed above:  1. Chest pain -with multiple risk factors including significant family history of early coronary artery disease, untreated hypertension hyperlipidemia.  Will order coronary CTA and tailor therapy based on results.  2.  Palpitations -we will obtain 14-day ZIO patch monitor.  3.  Hypertension -high blood pressure for someone on these young and  this with, I will start her on medication for blood pressure based on results of ZIO patch monitor and coronary CT that shows mid level of LVH.  4.  Hyperlipidemia -we will taylor her therapy based on results of coronary CTA.   Medication Adjustments/Labs and Tests Ordered: Current medicines are reviewed at length with the patient today.  Concerns regarding medicines are outlined above.  No orders of the defined types were placed in this encounter.  No orders of the defined types were placed in this encounter.   There are no Patient Instructions on file for this visit.  Signed, Ena Dawley, MD  03/13/2019 11:12 AM    [place]

## 2019-03-13 NOTE — Patient Instructions (Addendum)
Medication Instructions:   Your physician recommends that you continue on your current medications as directed. Please refer to the Current Medication list given to you today.  If you need a refill on your cardiac medications before your next appointment, please call your pharmacy.    Lab work:  BMET--IN THE NEAR FUTURE--WILL BE ARRANGED PRIOR TO WHEN YOUR CORONARY CT IS SCHEDULED.   If you have labs (blood work) drawn today and your tests are completely normal, you will receive your results only by: [name] MyChart Message (if you have MyChart) OR . A paper copy in the mail If you have any lab test that is abnormal or we need to change your treatment, we will call you to review the results.    Testing/Procedures:  3-14 DAY LONG-TERM ZIO PATCH FOR PALPITATIONS   CORONARY CT Your cardiac CT will be scheduled at one of the below locations:   [facility] [place], [place] [address] [phone]    If scheduled at [facility], please arrive at the [facility] main entrance of [facility] 30-45 minutes prior to test start time. Proceed to the [facility] Radiology Department (first floor) to check-in and test prep.   Please follow these instructions carefully (unless otherwise directed):  On the Night Before the Test: . Be sure to Drink plenty of water. . Do not consume any caffeinated/decaffeinated beverages or chocolate 12 hours prior to your test. . Do not take any antihistamines 12 hours prior to your test.   On the Day of the Test: . Drink plenty of water. Do not drink any water within one hour of the test. . Do not eat any food 4 hours prior to the test. . You may take your regular medications prior to the test.  . Take metoprolol 50 MG (Lopressor) two hours prior to test. TAKE 1/2 TABLET PO 2 HOURS PRIOR TO THIS TEST AND TAKE THE OTHER 1/2 TABLET WITH YOU TO THE TEST INCASE YOU NEED THIS.  [name] FEMALES- please wear underwire-free  bra if available       After the Test: . Drink plenty of water. . After receiving IV contrast, you may experience a mild flushed feeling. This is normal. . On occasion, you may experience a mild rash up to 24 hours after the test. This is not dangerous. If this occurs, you can take Benadryl 25 mg and increase your fluid intake. . If you experience trouble breathing, this can be serious. If it is severe call 911 IMMEDIATELY. If it is mild, please call our office.   Please contact the cardiac imaging nurse navigator should you have any questions/concerns Marchia Bond, RN Navigator Cardiac Imaging [facility] [phone] Office  [phone] Cell    Follow-Up:  2 MONTHS WITH AN EXTENDER IN OUR OFFICE

## 2019-03-15 ENCOUNTER — Telehealth: Payer: Self-pay

## 2019-03-15 NOTE — Telephone Encounter (Signed)
LM with instructions for her monitor. Ordered 14 day Zio to be sent to her home.

## 2019-03-20 ENCOUNTER — Ambulatory Visit (INDEPENDENT_AMBULATORY_CARE_PROVIDER_SITE_OTHER): Payer: Commercial Managed Care - PPO

## 2019-03-20 DIAGNOSIS — R072 Precordial pain: Secondary | ICD-10-CM

## 2019-03-20 DIAGNOSIS — R002 Palpitations: Secondary | ICD-10-CM | POA: Diagnosis not present

## 2019-03-20 DIAGNOSIS — E785 Hyperlipidemia, unspecified: Secondary | ICD-10-CM

## 2019-03-20 DIAGNOSIS — Z8249 Family history of ischemic heart disease and other diseases of the circulatory system: Secondary | ICD-10-CM

## 2019-04-05 ENCOUNTER — Telehealth: Payer: Self-pay | Admitting: Cardiology

## 2019-04-05 ENCOUNTER — Encounter: Payer: Self-pay | Admitting: *Deleted

## 2019-04-05 MED ORDER — METOPROLOL TARTRATE 100 MG PO TABS: 100.0000 mg | ORAL_TABLET | Freq: Once | ORAL | 0 refills | Status: DC

## 2019-04-05 NOTE — Telephone Encounter (Signed)
Patient is schedule for her Ct on e 04-14-19.   She check with the [facility]  to see if the rx for Lopressor was ready for her test.   They stated that they didn't get the rx.

## 2019-04-05 NOTE — Telephone Encounter (Signed)
Spoke with the pt and informed her that this medication was called in at her OV with Dr Meda Coffee on 9/21, but she never went to pick this up, so the prescription expired.  Informed the pt that I will send this in again, and advised her to go and pick this prescription up today, and follow very strict instructions given to her to prepare for her Coronary CT, as discussed with her at 9/21 OV.  Also informed the pt that I will send her these instructions to her mychart account, to follow as well. Pt is aware that Dr. Meda Coffee wanted the 100 mg tablet of Metoprolol tartrate to be called into her pharmacy as one time dose, but she provided her instructions for her to take metoprolol tartrate 50 mg (1/2 tablet) po 2 hours prior to Coronary CT, and take the other 1/2 tablet to the CT appt, incase this additional dose is needed at that appt/time. Pt was clear on those instructions and verbalized understanding to take 1/2 tab (50 mg) po 2 hours prior to CT, and take the other 1/2 tablet if needed and instructed by CT Technicians/nurse.   Pt verbalized understanding and agrees with this plan.  Below is also a copy of her CT instructions provided and endorsed to the pt     CORONARY CT Your cardiac CT will be scheduled at one of the below locations:   [facility] [address] [place], [place] [address] [phone]    If scheduled at [facility], please arrive at the [facility] main entrance of [facility] 30-45 minutes prior to test start time. Proceed to the [facility] Radiology Department (first floor) to check-in and test prep.   Please follow these instructions carefully (unless otherwise directed):  On the Night Before the Test:  Be sure to Drink plenty of water.  Do not consume any caffeinated/decaffeinated beverages or chocolate 12 hours prior to your test.  Do not take any antihistamines 12 hours prior to your test.   On the Day of the  Test:  Drink plenty of water. Do not drink any water within one hour of the test.  Do not eat any food 4 hours prior to the test.  You may take your regular medications prior to the test.   Take metoprolol 50 MG (Lopressor) two hours prior to test. TAKE 1/2 TABLET PO 2 HOURS PRIOR TO THIS TEST AND TAKE THE OTHER 1/2 TABLET WITH YOU TO THE TEST INCASE YOU NEED THIS.   FEMALES- please wear underwire-free bra if available       After the Test:  Drink plenty of water.  After receiving IV contrast, you may experience a mild flushed feeling. This is normal.  On occasion, you may experience a mild rash up to 24 hours after the test. This is not dangerous. If this occurs, you can take Benadryl 25 mg and increase your fluid intake.  If you experience trouble breathing, this can be serious. If it is severe call 911 IMMEDIATELY. If it is mild, please call our office.   Please contact the cardiac imaging nurse navigator should you have any questions/concerns Marchia Bond, RN Navigator Cardiac Imaging [place] [facility] [phone] Office  [phone] Cell

## 2019-04-07 ENCOUNTER — Other Ambulatory Visit: Payer: Self-pay

## 2019-04-07 ENCOUNTER — Other Ambulatory Visit: Payer: Commercial Managed Care - PPO | Admitting: *Deleted

## 2019-04-07 DIAGNOSIS — R002 Palpitations: Secondary | ICD-10-CM

## 2019-04-07 DIAGNOSIS — R072 Precordial pain: Secondary | ICD-10-CM

## 2019-04-07 DIAGNOSIS — Z8249 Family history of ischemic heart disease and other diseases of the circulatory system: Secondary | ICD-10-CM

## 2019-04-07 DIAGNOSIS — E785 Hyperlipidemia, unspecified: Secondary | ICD-10-CM

## 2019-04-07 LAB — BASIC METABOLIC PANEL
BUN/Creatinine Ratio: 18 (ref 9–23)
BUN: 13 mg/dL (ref 6–24)
CO2: 24 mmol/L (ref 20–29)
Calcium: 9.4 mg/dL (ref 8.7–10.2)
Chloride: 99 mmol/L (ref 96–106)
Creatinine, Ser: 0.74 mg/dL (ref 0.57–1.00)
GFR calc Af Amer: 111 mL/min/{1.73_m2} (ref >59)
GFR calc non Af Amer: 96 mL/min/{1.73_m2} (ref >59)
Glucose: 85 mg/dL (ref 65–99)
Potassium: 4.2 mmol/L (ref 3.5–5.2)
Sodium: 136 mmol/L (ref 134–144)

## 2019-04-10 ENCOUNTER — Encounter: Payer: Self-pay | Admitting: Family Medicine

## 2019-04-13 ENCOUNTER — Telehealth (HOSPITAL_COMMUNITY): Payer: Self-pay | Admitting: Emergency Medicine

## 2019-04-13 NOTE — Telephone Encounter (Signed)
Left message on voicemail with name and callback number Clavin Ruhlman RN Navigator Cardiac Imaging [facility] [phone] Office [phone] Cell  

## 2019-04-14 ENCOUNTER — Encounter: Payer: Commercial Managed Care - PPO | Admitting: *Deleted

## 2019-04-14 ENCOUNTER — Ambulatory Visit (HOSPITAL_COMMUNITY)
Admission: RE | Admit: 2019-04-14 | Discharge: 2019-04-14 | Disposition: A | Discharge status: Home / Self Care | Payer: Commercial Managed Care - PPO | Source: Ambulatory Visit | Attending: Cardiology | Admitting: Cardiology

## 2019-04-14 ENCOUNTER — Other Ambulatory Visit: Payer: Self-pay

## 2019-04-14 DIAGNOSIS — R002 Palpitations: Secondary | ICD-10-CM | POA: Diagnosis present

## 2019-04-14 DIAGNOSIS — Z8249 Family history of ischemic heart disease and other diseases of the circulatory system: Secondary | ICD-10-CM | POA: Diagnosis present

## 2019-04-14 DIAGNOSIS — E785 Hyperlipidemia, unspecified: Secondary | ICD-10-CM | POA: Diagnosis present

## 2019-04-14 DIAGNOSIS — R072 Precordial pain: Secondary | ICD-10-CM

## 2019-04-14 DIAGNOSIS — Z006 Encounter for examination for normal comparison and control in clinical research program: Secondary | ICD-10-CM

## 2019-04-14 MED ORDER — NITROGLYCERIN 0.4 MG SL SUBL
0.8000 mg | SUBLINGUAL_TABLET | SUBLINGUAL | Status: DC | PRN
  Administered 2019-04-14: 0.8 mg via SUBLINGUAL

## 2019-04-14 MED ORDER — NITROGLYCERIN 0.4 MG SL SUBL
SUBLINGUAL_TABLET | SUBLINGUAL | Status: AC
  Filled 2019-04-14: qty 2

## 2019-04-14 MED ORDER — IOHEXOL 350 MG/ML SOLN
80.0000 mL | Freq: Once | INTRAVENOUS | Status: AC | PRN
  Administered 2019-04-14: 80 mL via INTRAVENOUS

## 2019-04-14 NOTE — Research (Signed)
cadfem Informed Consent   Subject Name: Martha Barton  Subject met inclusion and exclusion criteria.  The informed consent form, study requirements and expectations were reviewed with the subject and questions and concerns were addressed prior to the signing of the consent form.  The subject verbalized understanding of the trial requirements.  The subject agreed to participate in the CADFEM trial and signed the informed consent at 0640 on 14-Apr-2019.  The informed consent was obtained prior to performance of any protocol-specific procedures for the subject.  A copy of the signed informed consent was given to the subject and a copy was placed in the subject's medical record.   Lafe Garin

## 2019-04-18 ENCOUNTER — Telehealth: Payer: Self-pay | Admitting: Cardiology

## 2019-04-18 NOTE — Telephone Encounter (Signed)
New Message     Pt is returning call for test results    Please call back

## 2019-04-18 NOTE — Telephone Encounter (Signed)
Notes recorded by Michae Kava, CMA on 04/18/2019 at 8:30 AM EDT  Pt has been notified of both the Coronary CT and monitor results by phone with verbal understanding. Pt aware to keep her appt with Melina Copa, PAC on 04/25/19. Pt is glad to hear the CT is normal, pt just not sure why she has some chest discomfort at times. I advised her to keep upcoming appt and write down questions to d/w the PA. Pt is thankful for the call and is agreeable to plan of care. The patient has been notified of the result and verbalized understanding. All questions (if any) were answered.  Julaine Hua, CMA 04/18/2019 8:30 AM

## 2019-04-23 ENCOUNTER — Encounter: Payer: Self-pay | Admitting: Physician Assistant

## 2019-04-23 NOTE — Progress Notes (Signed)
Cardiology Office Note    Date:  04/25/2019   ID:  Martha Barton, DOB 09-Apr-1971, MRN [id number]  PCP:  Rita Ohara, MD  Cardiologist:  Ena Dawley, MD  Electrophysiologist:  None   Chief Complaint: f/u cardiac testing  History of Present Illness:   Martha Barton is a 48 y.o. female with history of hyperlipidemia (followed by primary care), borderline HTN, recently diagnosed PSVT, second degree type 1 AV block, and family history of CAD who is here to follow-up recent cardiac testing. Dr. Francesca Oman recent [facility] from 03/13/19 reviewed. She has very strong family history of premature coronary artery disease. Her father had first myocardial infarction in his 57s and bypass at age of 32.  Both of her grandfathers also underwent bypass surgery.  She has been recently diagnosed with hypertension on several occasions however have not been starting her medications. Her LDL has always been elevated despite effort in her diet and exercise. She exercises almost daily with high aerobic classes. She has noticed chest pain with mixed typical/atypical features. Coronary CTA 04/14/19 was normal. Event monitor showed sinus bradycardia to sinus tachycardia with 6 very short runs of SVT, one episode of second degree Mobitz type AV block during sleeping hours; Dr. Meda Coffee recommended no change in therapy. Last labs 03/2019 showed K 4.2, Cr 0.74, 01/2019 LDL 137, AST/ALT wnl, normal CBC, normal TSH.  She returns for follow-up overall feeling well. She continues to have episodic atypical chest discomfort at different times, mostly with exertion but not reliably every time she exerts herself. It also happens during hot flashes. Her prior palpitations have resolved. She is not reporting any other symptoms of shortness of breath, pre-syncope, syncope or edema.  Past Medical History:  Diagnosis Date  . Irregular menses   . Menstrual headache   . Mobitz type 1 second degree atrioventricular block   . PSVT (paroxysmal  supraventricular tachycardia) ([place])   . Pure hypercholesterolemia 09/02/2012   Lab Results  Component Value Date   CHOL 246* 09/01/2012   HDL 75 09/01/2012   LDLCALC 154* 09/01/2012   TRIG 87 09/01/2012   CHOLHDL 3.3 09/01/2012      . Unspecified vitamin D deficiency     Past Surgical History:  Procedure Laterality Date  . APPENDECTOMY      Current Medications: Current Meds  Medication Sig  . calcium carbonate (TUMS - DOSED IN MG ELEMENTAL CALCIUM) 500 MG chewable tablet Chew 1 tablet by mouth daily.   . cholecalciferol (VITAMIN D) 1000 units tablet Take 1,000 Units by mouth daily.  . Multiple Vitamins-Minerals (MULTIVITAMIN WITH MINERALS) tablet Take 1 tablet by mouth daily.      Allergies:   Patient has no known allergies.   Social History   Socioeconomic History  . Marital status: Married    Spouse name: Not on file  . Number of children: Not on file  . Years of education: Not on file  . Highest education level: Not on file  Occupational History  . Occupation: homemaker  Social Needs  . Financial resource strain: Not on file  . Food insecurity    Worry: Not on file    Inability: Not on file  . Transportation needs    Medical: Not on file    Non-medical: Not on file  Tobacco Use  . Smoking status: Never Smoker  . Smokeless tobacco: Never Used  Substance and Sexual Activity  . Alcohol use: Yes    Comment: very rare  . Drug use: No  .  Sexual activity: Yes    Partners: Male    Birth control/protection: Condom  Lifestyle  . Physical activity    Days per week: Not on file    Minutes per session: Not on file  . Stress: Not on file  Relationships  . Social Herbalist on phone: Not on file    Gets together: Not on file    Attends religious service: Not on file    Active member of club or organization: Not on file    Attends meetings of clubs or organizations: Not on file    Relationship status: Not on file  Other Topics Concern  . Not on file  Social  History Narrative   Agricultural engineer (former Pharmacist, hospital).  Lives with husband and 2 children (91 yo son, 44 yo daughter 02/2019), 1 dog.     Family History:  The patient's family history includes Allergies in her mother; Breast cancer (age of onset: 18) in her maternal aunt; Cancer in her father, maternal grandmother, and paternal grandfather; Colon cancer (age of onset: 23) in her father; Coronary artery disease in her father and maternal aunt; Diabetes in her father; Heart disease in her maternal grandfather and paternal grandfather; Hyperlipidemia in her mother; Hypertension in her father; Osteoporosis in her maternal grandmother and mother.  ROS:   Please see the history of present illness.   All other systems are reviewed and otherwise negative.    EKGs/Labs/Other Studies Reviewed:    Studies reviewed were summarized above.   EKG:  EKG is not ordered today  Recent Labs: 02/20/2019: ALT 10; Hemoglobin 13.6; Platelets 257; TSH 1.650 04/07/2019: BUN 13; Creatinine, Ser 0.74; Potassium 4.2; Sodium 136  Recent Lipid Panel    Component Value Date/Time   CHOL 221 (H) 02/20/2019 1314   TRIG 95 02/20/2019 1314   HDL 67 02/20/2019 1314   CHOLHDL 3.3 02/20/2019 1314   CHOLHDL 2.9 02/12/2016 0955   VLDL 17 02/12/2016 0955   LDLCALC 137 (H) 02/20/2019 1314    PHYSICAL EXAM:    VS:  BP 120/90   Pulse 82   Ht 5\' 7"  (1.702 m)   Wt 139 lb (63 kg)   SpO2 98%   BMI 21.77 kg/m   BMI: Body mass index is 21.77 kg/m.  GEN: Well nourished, well developed WF, in no acute distress HEENT: normocephalic, atraumatic Neck: no JVD, carotid bruits, or masses Cardiac: RRR; no murmurs, rubs, or gallops, no edema  Respiratory:  clear to auscultation bilaterally, normal work of breathing GI: soft, nontender, nondistended, + BS MS: no deformity or atrophy Skin: warm and dry, no rash Neuro:  Alert and Oriented x 3, Strength and sensation are intact, follows commands Psych: euthymic mood, full affect  Wt  Readings from Last 3 Encounters:  04/25/19 139 lb (63 kg)  03/13/19 140 lb 9.6 oz (63.8 kg)  03/09/19 141 lb (64 kg)     ASSESSMENT & PLAN:   1. Atypical chest pain - coronary CT reassuring. Event monitor showed that symptoms correlated to sinus tachycardia (likely during exertion) but given Mobitz 1, Dr. Meda Coffee did not recommend any other changes at present time unless she still had significant symptoms. She has also noticed episodic elevated BP recently at times. Will undertake a 2D echocardiogram for completeness to exclude structural abnormality that may not have been picked up on her coronary CTA. I offered a trial of amlodipine 2.5mg  daily for her to try in case episodes are provoked by exertional hypertension or  coronary spasm. 2. PSVT and 2nd degree type 1 AV block on event monitor - discussed with patient today. Continue clinical surveillance for now. No high risk symptoms. 3. Essential HTN - see above. Follow with addition of amlodipine if she chooses to take this. SBP normal but diastolic continues to run a little high.  Disposition: F/u with Dr. Meda Coffee virtually in 4 months.  Medication Adjustments/Labs and Tests Ordered: Current medicines are reviewed at length with the patient today.  Concerns regarding medicines are outlined above. Medication changes, Labs and Tests ordered today are summarized above and listed in the Patient Instructions accessible in Encounters.   Signed, Charlie Pitter, PA-C  04/25/2019 10:53 AM    [address], [place], [place]  [address] Phone: [phone]; Fax: [phone]

## 2019-04-25 ENCOUNTER — Other Ambulatory Visit: Payer: Self-pay

## 2019-04-25 ENCOUNTER — Encounter: Payer: Self-pay | Admitting: Physician Assistant

## 2019-04-25 ENCOUNTER — Ambulatory Visit: Payer: Commercial Managed Care - PPO | Admitting: Physician Assistant

## 2019-04-25 VITALS — BP 120/90 | HR 82 | Ht 67.0 in | Wt 139.0 lb

## 2019-04-25 DIAGNOSIS — R0789 Other chest pain: Secondary | ICD-10-CM | POA: Diagnosis not present

## 2019-04-25 DIAGNOSIS — I441 Atrioventricular block, second degree: Secondary | ICD-10-CM | POA: Diagnosis not present

## 2019-04-25 DIAGNOSIS — I10 Essential (primary) hypertension: Secondary | ICD-10-CM

## 2019-04-25 DIAGNOSIS — I471 Supraventricular tachycardia: Secondary | ICD-10-CM

## 2019-04-25 MED ORDER — AMLODIPINE BESYLATE 2.5 MG PO TABS: 2.5000 mg | ORAL_TABLET | Freq: Every day | ORAL | 3 refills | Status: DC

## 2019-04-25 NOTE — Patient Instructions (Signed)
Medication Instructions:  Your physician has recommended you make the following change in your medication:  1.  STAR Amlodipine 2.5 mg taking 1 tablet daily (if you decide to start, it is at your pharmacy)  *If you need a refill on your cardiac medications before your next appointment, please call your pharmacy*  Lab Work: None ordered  If you have labs (blood work) drawn today and your tests are completely normal, you will receive your results only by: [name] MyChart Message (if you have MyChart) OR . A paper copy in the mail If you have any lab test that is abnormal or we need to change your treatment, we will call you to review the results.  Testing/Procedures: Your physician has requested that you have an echocardiogram. Echocardiography is a painless test that uses sound waves to create images of your heart. It provides your doctor with information about the size and shape of your heart and how well your heart's chambers and valves are working. This procedure takes approximately one hour. There are no restrictions for this procedure.    Follow-Up: At [facility], you and your health needs are our priority.  As part of our continuing mission to provide you with exceptional heart care, we have created designated Provider Care Teams.  These Care Teams include your primary Cardiologist (physician) and Advanced Practice Providers (APPs -  Physician Assistants and Nurse Practitioners) who all work together to provide you with the care you need, when you need it.  Your next appointment:   4 months  The format for your next appointment:   Virtual Visit   Provider:   Ena Dawley, MD  Other Instructions  Echocardiogram An echocardiogram is a procedure that uses painless sound waves (ultrasound) to produce an image of the heart. Images from an echocardiogram can provide important information about:  Signs of coronary artery disease (CAD).  Aneurysm detection. An aneurysm is a weak  or damaged part of an artery wall that bulges out from the normal force of blood pumping through the body.  Heart size and shape. Changes in the size or shape of the heart can be associated with certain conditions, including heart failure, aneurysm, and CAD.  Heart muscle function.  Heart valve function.  Signs of a past heart attack.  Fluid buildup around the heart.  Thickening of the heart muscle.  A tumor or infectious growth around the heart valves. Tell a health care provider about:  Any allergies you have.  All medicines you are taking, including vitamins, herbs, eye drops, creams, and over-the-counter medicines.  Any blood disorders you have.  Any surgeries you have had.  Any medical conditions you have.  Whether you are pregnant or may be pregnant. What are the risks? Generally, this is a safe procedure. However, problems may occur, including:  Allergic reaction to dye (contrast) that may be used during the procedure. What happens before the procedure? No specific preparation is needed. You may eat and drink normally. What happens during the procedure?   An IV tube may be inserted into one of your veins.  You may receive contrast through this tube. A contrast is an injection that improves the quality of the pictures from your heart.  A gel will be applied to your chest.  A wand-like tool (transducer) will be moved over your chest. The gel will help to transmit the sound waves from the transducer.  The sound waves will harmlessly bounce off of your heart to allow the heart images  to be captured in real-time motion. The images will be recorded on a computer. The procedure may vary among health care providers and hospitals. What happens after the procedure?  You may return to your normal, everyday life, including diet, activities, and medicines, unless your health care provider tells you not to do that. Summary  An echocardiogram is a procedure that uses  painless sound waves (ultrasound) to produce an image of the heart.  Images from an echocardiogram can provide important information about the size and shape of your heart, heart muscle function, heart valve function, and fluid buildup around your heart.  You do not need to do anything to prepare before this procedure. You may eat and drink normally.  After the echocardiogram is completed, you may return to your normal, everyday life, unless your health care provider tells you not to do that. This information is not intended to replace advice given to you by your health care provider. Make sure you discuss any questions you have with your health care provider. Document Released: 06/05/2000 Document Revised: 09/29/2018 Document Reviewed: 07/11/2016 Elsevier Patient Education  2020 [employer].

## 2019-04-26 ENCOUNTER — Telehealth: Payer: Self-pay | Admitting: Physician Assistant

## 2019-04-26 MED ORDER — AMLODIPINE BESYLATE 2.5 MG PO TABS: 2.5000 mg | ORAL_TABLET | Freq: Every day | ORAL | 3 refills | Status: DC

## 2019-04-26 NOTE — Telephone Encounter (Signed)
Returned the call to the pt.  Left a message letting her know that the prescription has been fixed and if she needed further assistance, to call back.

## 2019-04-26 NOTE — Addendum Note (Signed)
Addended by: Gaetano Net on: 04/26/2019 03:12 PM   Modules accepted: Orders

## 2019-04-26 NOTE — Telephone Encounter (Signed)
Pt calling stating that her medication amlodipine was sent to her pharmacy dispensing only 6 tablets. Is this all the tablets that pt is supposed to get? Please address

## 2019-04-26 NOTE — Telephone Encounter (Signed)
Please assist! Not sure why 6 tablets. Should be 30, suspect computer error

## 2019-05-03 ENCOUNTER — Ambulatory Visit (HOSPITAL_COMMUNITY): Payer: Commercial Managed Care - PPO | Attending: Cardiovascular Disease

## 2019-05-03 ENCOUNTER — Telehealth: Payer: Self-pay | Admitting: *Deleted

## 2019-05-03 ENCOUNTER — Other Ambulatory Visit: Payer: Self-pay

## 2019-05-03 DIAGNOSIS — R0789 Other chest pain: Secondary | ICD-10-CM

## 2019-05-03 NOTE — Telephone Encounter (Signed)
-----   Message from Charlie Pitter, [place] sent at 05/03/2019  2:26 PM EST ----- Please let patient know echocardiogram was normal - very reassuring. Normal pump function, no significant valvular disease or thickening. Continue plan as discussed. Dayna Dunn PA-C

## 2019-05-03 NOTE — Telephone Encounter (Signed)
Call placed to pt re: echocardiogram results, left a message for pt to call back.  

## 2019-05-15 NOTE — Telephone Encounter (Signed)
Normal results sent via mychart

## 2019-05-24 ENCOUNTER — Encounter: Payer: Commercial Managed Care - PPO | Admitting: Family Medicine

## 2019-06-22 DIAGNOSIS — S52202A Unspecified fracture of shaft of left ulna, initial encounter for closed fracture: Secondary | ICD-10-CM

## 2019-06-22 HISTORY — DX: Unspecified fracture of shaft of left ulna, initial encounter for closed fracture: S52.202A

## 2019-06-29 ENCOUNTER — Encounter: Payer: Self-pay | Admitting: *Deleted

## 2019-08-24 ENCOUNTER — Encounter: Payer: Self-pay | Admitting: *Deleted

## 2019-08-24 ENCOUNTER — Telehealth: Payer: Self-pay | Admitting: *Deleted

## 2019-08-24 ENCOUNTER — Other Ambulatory Visit: Payer: Self-pay

## 2019-08-24 ENCOUNTER — Encounter: Payer: Self-pay | Admitting: Cardiology

## 2019-08-24 ENCOUNTER — Telehealth (INDEPENDENT_AMBULATORY_CARE_PROVIDER_SITE_OTHER): Payer: Commercial Managed Care - PPO | Admitting: Cardiology

## 2019-08-24 VITALS — BP 126/84 | HR 69 | Ht 67.0 in | Wt 137.0 lb

## 2019-08-24 DIAGNOSIS — I10 Essential (primary) hypertension: Secondary | ICD-10-CM | POA: Diagnosis not present

## 2019-08-24 DIAGNOSIS — E785 Hyperlipidemia, unspecified: Secondary | ICD-10-CM | POA: Diagnosis not present

## 2019-08-24 DIAGNOSIS — I441 Atrioventricular block, second degree: Secondary | ICD-10-CM

## 2019-08-24 DIAGNOSIS — R002 Palpitations: Secondary | ICD-10-CM

## 2019-08-24 DIAGNOSIS — R072 Precordial pain: Secondary | ICD-10-CM

## 2019-08-24 MED ORDER — RED YEAST RICE 600 MG PO TABS: 600.0000 mg | ORAL_TABLET | Freq: Every day | ORAL | 2 refills | Status: DC

## 2019-08-24 MED ORDER — LOSARTAN POTASSIUM 25 MG PO TABS: 25.0000 mg | ORAL_TABLET | Freq: Every day | ORAL | 2 refills | Status: DC

## 2019-08-24 NOTE — Telephone Encounter (Signed)

## 2019-08-24 NOTE — Progress Notes (Signed)
Cardiology Office Note    Date:  08/24/2019   ID:  Martha Barton, DOB Nov 16, 1970, MRN [id number]  PCP:  Martha Ohara, MD  Cardiologist:  Martha Dawley, MD  Electrophysiologist:  None   Chief Complaint: f/u cardiac testing  History of Present Illness:   Martha Barton is a 49 y.o. female with history of hyperlipidemia (followed by primary care), borderline HTN, recently diagnosed PSVT, second degree type 1 AV block, and family history of CAD who is here to follow-up recent cardiac testing. Dr. Francesca Oman recent [facility] from 03/13/19 reviewed. She has very strong family history of premature coronary artery disease. Her father had first myocardial infarction in his 12s and bypass at age of 18.  Both of her grandfathers also underwent bypass surgery.  She has been recently diagnosed with hypertension on several occasions however have not been starting her medications. Her LDL has always been elevated despite effort in her diet and exercise. She exercises almost daily with high aerobic classes. She has noticed chest pain with mixed typical/atypical features. Coronary CTA 04/14/19 was normal. Event monitor showed sinus bradycardia to sinus tachycardia with 6 very short runs of SVT, one episode of second degree Mobitz type AV block during sleeping hours; Dr. Meda Coffee recommended no change in therapy. Last labs 03/2019 showed K 4.2, Cr 0.74, 01/2019 LDL 137, AST/ALT wnl, normal CBC, normal TSH.  She returns for follow-up overall feeling well. She continues to have episodic atypical chest discomfort at different times, mostly with exertion but not reliably every time she exerts herself. It also happens during hot flashes. Her prior palpitations have resolved. She is not reporting any other symptoms of shortness of breath, pre-syncope, syncope or edema.  08/24/19 - 6 months follow up, unfortunately patient had an accident on a ski lift where she broke her arm in December 2020 and is still in a cast and healing.  She  has been trying to exercise as much as she can and stick to healthy diet however her LDL cholesterol remains elevated.  She gets occasional episodes of chest pain not related to exertion, her blood pressure is borderline around 07/22/1938 however she has not been taking amlodipine as she is concerned about having a negative effect on bone healing.  She gets occasional palpitations at night.   Past Medical History:  Diagnosis Date  . Irregular menses   . Left ulnar fracture 06/22/2019   acute traumatic left ulna nightstick fracture, non-displaced  . Menstrual headache   . Mobitz type 1 second degree atrioventricular block   . PSVT (paroxysmal supraventricular tachycardia) ([place])   . Pure hypercholesterolemia 09/02/2012   Lab Results  Component Value Date   CHOL 246* 09/01/2012   HDL 75 09/01/2012   LDLCALC 154* 09/01/2012   TRIG 87 09/01/2012   CHOLHDL 3.3 09/01/2012      . Unspecified vitamin D deficiency    Past Surgical History:  Procedure Laterality Date  . APPENDECTOMY     Current Medications: Current Meds  Medication Sig  . Calcium Carbonate Antacid (TUMS ULTRA 1000 PO) Take 1 tablet by mouth daily.  . Cholecalciferol (VITAMIN D3) 125 MCG (5000 UT) TABS Take 1 tablet by mouth daily.  . magnesium oxide (MAG-OX) 400 MG tablet Take 400 mg by mouth daily.   Allergies:   Patient has no known allergies.   Social History   Socioeconomic History  . Marital status: Married    Spouse name: Not on file  . Number of children: Not on file  .  Years of education: Not on file  . Highest education level: Not on file  Occupational History  . Occupation: homemaker  Tobacco Use  . Smoking status: Never Smoker  . Smokeless tobacco: Never Used  Substance and Sexual Activity  . Alcohol use: Yes    Comment: very rare  . Drug use: No  . Sexual activity: Yes    Partners: Male    Birth control/protection: Condom  Other Topics Concern  . Not on file  Social History Narrative   Agricultural engineer (former  Pharmacist, hospital).  Lives with husband and 2 children (54 yo son, 62 yo daughter 02/2019), 1 dog.   Social Determinants of Health   Financial Resource Strain:   . Difficulty of Paying Living Expenses: Not on file  Food Insecurity:   . Worried About Charity fundraiser in the Last Year: Not on file  . Ran Out of Food in the Last Year: Not on file  Transportation Needs:   . Lack of Transportation (Medical): Not on file  . Lack of Transportation (Non-Medical): Not on file  Physical Activity:   . Days of Exercise per Week: Not on file  . Minutes of Exercise per Session: Not on file  Stress:   . Feeling of Stress : Not on file  Social Connections:   . Frequency of Communication with Friends and Family: Not on file  . Frequency of Social Gatherings with Friends and Family: Not on file  . Attends Religious Services: Not on file  . Active Member of Clubs or Organizations: Not on file  . Attends Archivist Meetings: Not on file  . Marital Status: Not on file     Family History:  The patient's family history includes Allergies in her mother; Breast cancer (age of onset: 75) in her maternal aunt; Cancer in her father, maternal grandmother, and paternal grandfather; Colon cancer (age of onset: 30) in her father; Coronary artery disease in her father and maternal aunt; Diabetes in her father; Heart disease in her maternal grandfather and paternal grandfather; Hyperlipidemia in her mother; Hypertension in her father; Osteoporosis in her maternal grandmother and mother.  ROS:   Please see the history of present illness.   All other systems are reviewed and otherwise negative.    EKGs/Labs/Other Studies Reviewed:    Studies reviewed were summarized above.   EKG:  EKG is not ordered today  Recent Labs: 02/20/2019: ALT 10; Hemoglobin 13.6; Platelets 257; TSH 1.650 04/07/2019: BUN 13; Creatinine, Ser 0.74; Potassium 4.2; Sodium 136  Recent Lipid Panel    Component Value Date/Time   CHOL 221  (H) 02/20/2019 1314   TRIG 95 02/20/2019 1314   HDL 67 02/20/2019 1314   CHOLHDL 3.3 02/20/2019 1314   CHOLHDL 2.9 02/12/2016 0955   VLDL 17 02/12/2016 0955   LDLCALC 137 (H) 02/20/2019 1314    PHYSICAL EXAM:    VS:  BP 126/84   Pulse 69   Ht 5\' 7"  (1.702 m)   Wt 137 lb (62.1 kg)   BMI 21.46 kg/m   BMI: Body mass index is 21.46 kg/m.  GEN: Well nourished, well developed WF, in no acute distress HEENT: normocephalic, atraumatic Neck: no JVD, carotid bruits, or masses Cardiac: RRR; no murmurs, rubs, or gallops, no edema  Respiratory:  clear to auscultation bilaterally, normal work of breathing GI: soft, nontender, nondistended, + BS MS: no deformity or atrophy Skin: warm and dry, no rash Neuro:  Alert and Oriented x 3, Strength and sensation are  intact, follows commands Psych: euthymic mood, full affect  Wt Readings from Last 3 Encounters:  08/24/19 137 lb (62.1 kg)  04/25/19 139 lb (63 kg)  03/13/19 140 lb 9.6 oz (63.8 kg)     ASSESSMENT & PLAN:   1. Atypical chest pain - coronary CT showed calcium score of 0 and no CAD, she is advised to continue exercising and learn relaxation techniques.   2. PSVT and 2nd degree type 1 AV block on event monitor - discussed with patient today. Continue clinical surveillance for now. No high risk symptoms.  She tends to be bradycardic especially at night, we will not prescribe any AV nodal blocking agents. 3. Essential HTN -I will switch amlodipine to losartan 25 mg daily. 4. Hyperlipidemia -with LDL of 137, will start red yeast rice 600 mg daily.  Disposition: F/u with Dr. Meda Coffee in person in 6 months, we will obtain labs including CBC CMP TSH and lipids and vitamin D prior to that appointment.  Medication Adjustments/Labs and Tests Ordered: Current medicines are reviewed at length with the patient today.  Concerns regarding medicines are outlined above. Medication changes, Labs and Tests ordered today are summarized above and listed in  the Patient Instructions accessible in Encounters.   Signed, Martha Dawley, MD  08/24/2019 9:58 AM    [facility] [address], [place], [place]  [address] Phone: [phone]; Fax: [phone]

## 2019-08-24 NOTE — Patient Instructions (Signed)
Medication Instructions:   STOP TAKING AMLODIPINE NOW  START TAKING LOSARTAN 25 MG BY MOUTH DAILY  START TAKING RED YEAST RICE 600 MG BY MOUTH DAILY  *If you need a refill on your cardiac medications before your next appointment, please call your pharmacy*   Lab Work:  PRIOR TO YOUR 6 MONTH FOLLOW-UP APPOINTMENT WITH DR. Andree Coss WILL CHECK CMET, CBC, TSH, VITAMIN D LEVEL, AND LIPIDS--PLEASE COME FASTING TO THIS LAB APPOINTMENT.   If you have labs (blood work) drawn today and your tests are completely normal, you will receive your results only by: [name] MyChart Message (if you have MyChart) OR . A paper copy in the mail If you have any lab test that is abnormal or we need to change your treatment, we will call you to review the results.   Follow-Up: At [facility], you and your health needs are our priority.  As part of our continuing mission to provide you with exceptional heart care, we have created designated Provider Care Teams.  These Care Teams include your primary Cardiologist (physician) and Advanced Practice Providers (APPs -  Physician Assistants and Nurse Practitioners) who all work together to provide you with the care you need, when you need it.  We recommend signing up for the patient portal called "MyChart".  Sign up information is provided on this After Visit Summary.  MyChart is used to connect with patients for Virtual Visits (Telemedicine).  Patients are able to view lab/test results, encounter notes, upcoming appointments, etc.  Non-urgent messages can be sent to your provider as well.   To learn more about what you can do with MyChart, go to [email or web address].    Your next appointment:   6 month(s)  The format for your next appointment:   In Person  Provider:   Ena Dawley, MD

## 2019-08-24 NOTE — Addendum Note (Signed)
Addended by: Nuala Alpha on: 08/24/2019 10:38 AM   Modules accepted: Orders

## 2019-10-25 ENCOUNTER — Other Ambulatory Visit: Payer: Self-pay | Admitting: Family Medicine

## 2019-10-25 DIAGNOSIS — Z1231 Encounter for screening mammogram for malignant neoplasm of breast: Secondary | ICD-10-CM

## 2019-11-13 ENCOUNTER — Ambulatory Visit
Admission: RE | Admit: 2019-11-13 | Discharge: 2019-11-13 | Disposition: A | Discharge status: Home / Self Care | Payer: Commercial Managed Care - PPO | Source: Ambulatory Visit | Attending: Family Medicine | Admitting: Family Medicine

## 2019-11-13 ENCOUNTER — Other Ambulatory Visit: Payer: Self-pay

## 2019-11-13 DIAGNOSIS — Z1231 Encounter for screening mammogram for malignant neoplasm of breast: Secondary | ICD-10-CM

## 2020-03-06 IMAGING — CT CT HEART MORP W/ CTA COR W/ SCORE W/ CA W/CM &/OR W/O CM
4 of 7 series · 8 of 20 positions shown, 9 images · IV contrast (APPLIED)
Comparison: None.
COMPARISON: None.

Addendum:
EXAM:
OVER-READ INTERPRETATION  CT CHEST

The following report is an over-read performed by radiologist Dr.
Mareko Mross [REDACTED] on 04/14/2019. This
over-read does not include interpretation of cardiac or coronary
anatomy or pathology. The coronary calcium score/coronary CTA
interpretation by the cardiologist is attached.
CLINICAL DATA: 48-year-old female with h/o hypertension,
hyperlipidemia, FH of early CAD, palpitations and chest pain.
Cardiac/Coronary  CTA
TECHNIQUE: The patient was scanned on a Phillips Force scanner.

[Series 6: best diast 68 % · axial · 0.37mm/px · z∈[+1057,+1108]mm · 2 of 382 slices shown, 3 images]
[im 128/382  vessel]
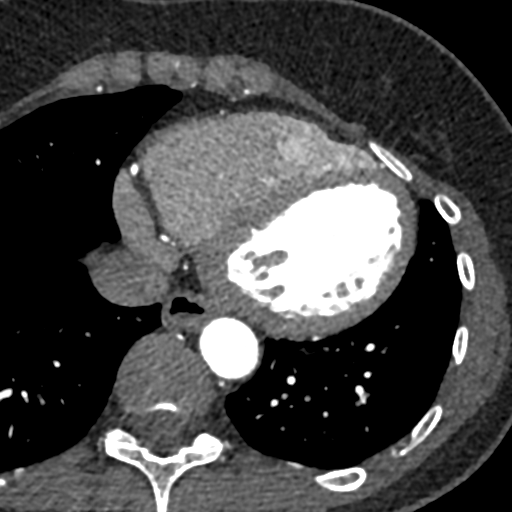
[im 128/382  lung]
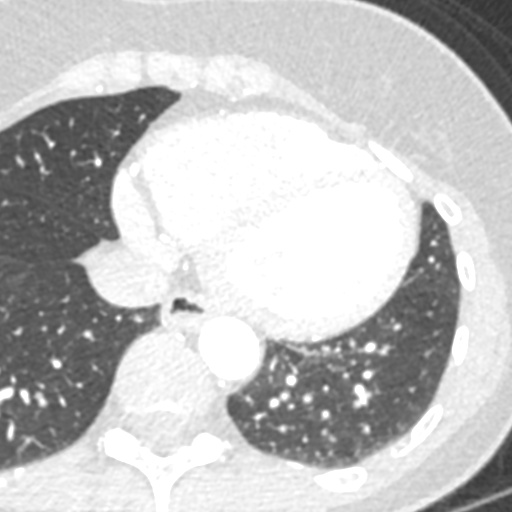
[im 255/382  vessel]
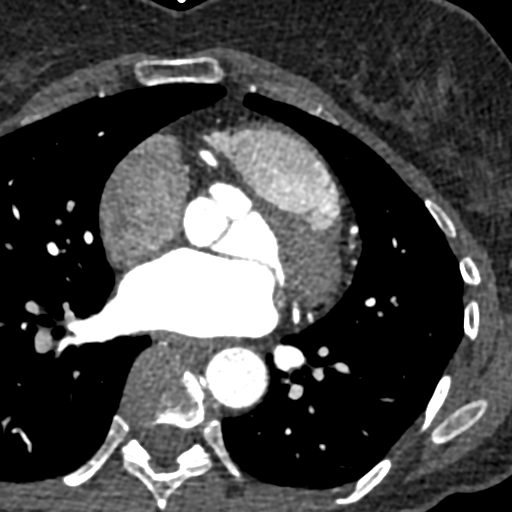

[Series 7: best syst 68 % · axial · 0.37mm/px · z∈[+1057,+1108]mm · 2 of 382 slices shown]
[im 128/382  vessel]
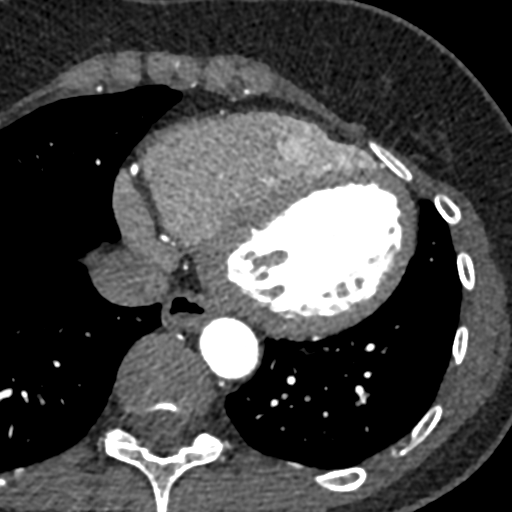
[im 255/382  vessel]
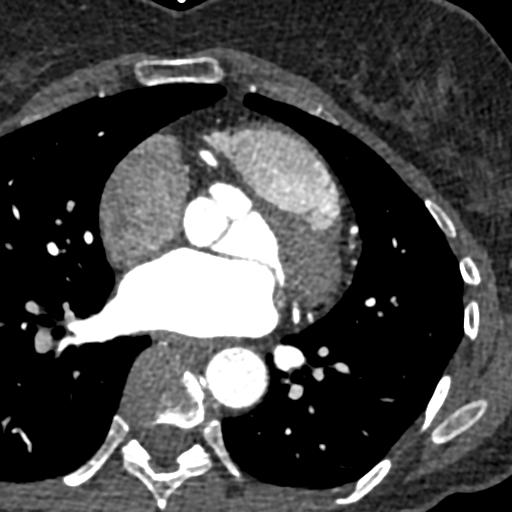

[Series 8: ts diast sharp 68 % · axial · 0.37mm/px · z∈[+1057,+1108]mm · 2 of 382 slices shown]
[im 128/382  lung]
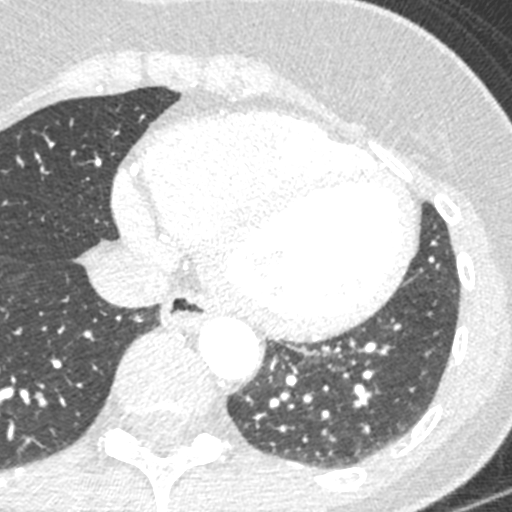
[im 255/382  lung]
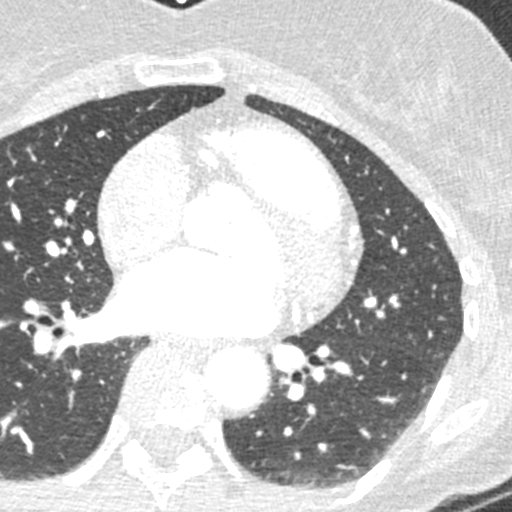

[Series 9: ts syst sharp 68 % · axial · 0.37mm/px · z∈[+1057,+1108]mm · 2 of 382 slices shown]
[im 128/382  lung]
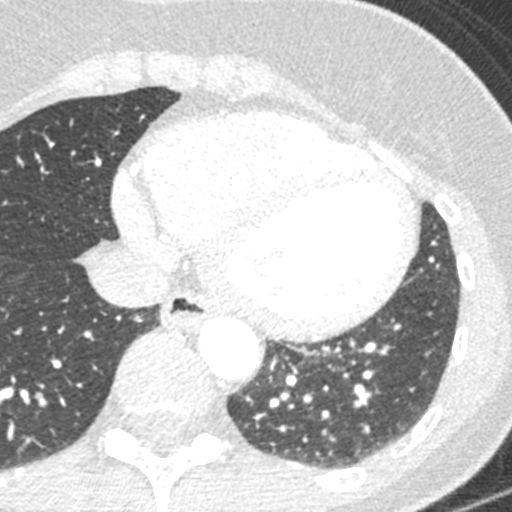
[im 255/382  lung]
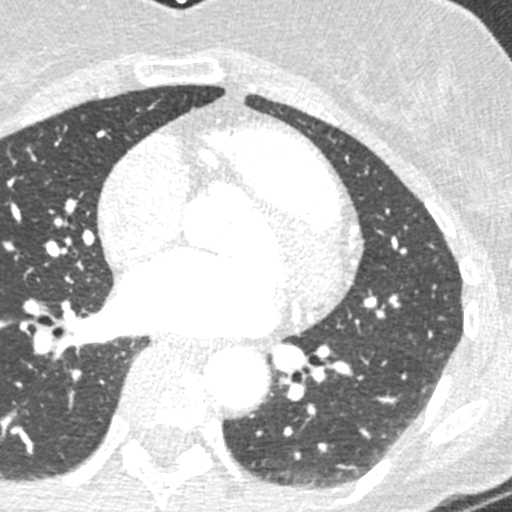

[8 of 20 positions shown; findings below may reference images not displayed]

FINDINGS: Within the visualized portions of the thorax there are no suspicious
appearing pulmonary nodules or masses, there is no acute
consolidative airspace disease, no pleural effusions, no
pneumothorax and no lymphadenopathy. Visualized portions of the
upper abdomen are unremarkable. There are no aggressive appearing
lytic or blastic lesions noted in the visualized portions of the
skeleton.
IMPRESSION: No significant incidental noncardiac findings are noted.
FINDINGS: A 100 kV prospective scan was triggered in the descending thoracic
aorta at 111 HU's. Axial non-contrast 3 mm slices were carried out
through the heart. The data set was analyzed on a dedicated work
station and scored using the Agatson method. Gantry rotation speed
was 250 msecs and collimation was .6 mm. No beta blockade and 0.8 mg
of sl NTG was given. The 3D data set was reconstructed in 5%
intervals of the 67-82 % of the R-R cycle. Diastolic phases were
analyzed on a dedicated work station using MPR, MIP and VRT modes.
The patient received 80 cc of contrast.

Aorta:  Normal size.  No calcifications.  No dissection.

Aortic Valve:  Trileaflet.  No calcifications.

Coronary Arteries:  Normal coronary origin.  Right dominance.

RCA is a large dominant artery that gives rise to PDA and PLA. There
is no plaque.

Left main is a large artery that gives rise to LAD and LCX arteries.
Left main has no plaque.

LAD is a large vessel that gives rise to one diagonal artery and has
no plaque.

LCX is a non-dominant artery that gives rise to one large OM1
branch. There is no plaque.

Other findings:

Normal pulmonary vein drainage into the left atrium.

Normal left atrial appendage without a thrombus.

Normal size of the pulmonary artery.
IMPRESSION: 1. Coronary calcium score of 0. This was 0 percentile for age and
sex matched control.

2. Normal coronary origin with right dominance.

3. CAD-RADS 0. No evidence of CAD (0%). Consider non-atherosclerotic
causes of chest pain.

*** End of Addendum ***
EXAM:
OVER-READ INTERPRETATION  CT CHEST

The following report is an over-read performed by radiologist Dr.
Mareko Mross [REDACTED] on 04/14/2019. This
over-read does not include interpretation of cardiac or coronary
anatomy or pathology. The coronary calcium score/coronary CTA
interpretation by the cardiologist is attached.
FINDINGS: Within the visualized portions of the thorax there are no suspicious
appearing pulmonary nodules or masses, there is no acute
consolidative airspace disease, no pleural effusions, no
pneumothorax and no lymphadenopathy. Visualized portions of the
upper abdomen are unremarkable. There are no aggressive appearing
lytic or blastic lesions noted in the visualized portions of the
skeleton.
IMPRESSION: No significant incidental noncardiac findings are noted.

## 2020-03-20 ENCOUNTER — Other Ambulatory Visit: Payer: Self-pay | Admitting: *Deleted

## 2020-03-20 ENCOUNTER — Telehealth: Payer: Self-pay | Admitting: *Deleted

## 2020-03-20 DIAGNOSIS — Z1211 Encounter for screening for malignant neoplasm of colon: Secondary | ICD-10-CM

## 2020-03-20 NOTE — Telephone Encounter (Signed)
Patient called and stated she was referred to [facility] for [facility]oscopy last year-she was not please with them as they told her they would not cover her colonoscopy since she was under 47. Her husband had his done with Dr Benson Norway and it was covered and he really liked him. Can she have a referral to him? Also, do you recommend him?

## 2020-03-20 NOTE — Telephone Encounter (Signed)
Referral done

## 2020-03-20 NOTE — Telephone Encounter (Signed)
Advise pt-- is now doing them for under 50 as well.  I do like Dr. Benson Norway, fine for referral there if that is her preference--he is a great doctor, just extra work on our end to get the results since they are on a different health record system.

## 2020-03-24 NOTE — Progress Notes (Signed)
Chief Complaint  Patient presents with  . Annual Exam    fasting annual exam, no eye exam see eye doctor. Still has chest discomfort-she will make appt with cardio. Has been experiencing pain with intercourse.     Martha Barton is a 49 y.o. female who presents for a complete physical.  She has the following concerns:  She fractured her L arm (ulna) related to a ski lift injury in 05/2019.  Since April she has been getting PT/OT for her wrist and L frozen shoulder. She completed therapy.  Can't put all her weight on left wrist for side planks, otherwise is doing quite well.  She denies any pain.  Shortly after completing PT, she threw her back out while turning to pick something up, just after getting released from PT.  She had severe pain for about a week (about a month ago).  This resolved.  Last year she was having left sided chest pain, palpitations and was referred to cardiologist. Blood pressures had been elevated here. She had Zio patch, CT calcium score and echo.  The monitor showed sinus bradycardia to sinus tachycardia. 6 very short runs of SVT, one episode of second degree Mobitz type AV block during sleeping hours. Normal coronary CTA with no evidence for CAD (calcium score of 0). She was prescribed amlodipine 2.5mg  in 04/2019.  She never really took this (patient had concerns about bone healing from fracture), no side effects. This was changed to losartan in March, and also red yeast rice was added by Dr. Meda Coffee. Bradycardic at night, recommended avoiding AV nodal acting agents. BP's at home are running 110-120's/60's-70's.  Lab Results  Component Value Date   CHOL 221 (H) 02/20/2019   HDL 67 02/20/2019   LDLCALC 137 (H) 02/20/2019   TRIG 95 02/20/2019   CHOLHDL 3.3 02/20/2019   She was to follow-up with Dr. Meda Coffee in person in 6 months (which would have been September), with labs prior, to include CBC CMP TSH and lipids and vitamin D. These labs/visit were never  scheduled. She is fasting for labs today.  Postmenopausal, no cycle since the end of 2019. +hot flashes and night sweats, mostly tolerable, fluctuates.  In the last 6 months she has developed vaginal dryness and some dyspareunia.  Vitamin D deficiency: Level was low at 26 in 2014.It was was normal on last check in 01/2016(38)when takinga MVI gummy daily, as well as 1000 IU of Vitamin D daily. Shecurrentlystill takes both supplements.  Hyperlipidemia:She has had her cholesterol be at goal with dietary changes, though on her last check 2 years ago, lipids were back to being high. She was counseled on cutting back on cheese and egg yolks (prior to last check she had been on vacation), and it had come down some last year. +FH heart disease. She has worked hard on limiting saturated fats and sugar. She eats oatmeal daily, lots of salads, beans. She has some cheese, but cut back. She uses mainly vinaigrette dressings. No longer eating boiled eggs regularly. Infrequent red meat. She continues on this diet (perhaps not as good as last year, due to some family stressors/illness)  Immunization History  Administered Date(s) Administered  . Influenza Split 05/08/2009, 05/09/2010  . Influenza,inj,Quad PF,6+ Mos 02/12/2016, 02/20/2019, 03/25/2020  . Td 06/22/2005  . Tdap 02/12/2016   She has had COVID vaccines (pfizer) Last Pap smear:01/2016 normal,no high risk HPV detected Last mammogram:10/2019 Last colonoscopy: never, has been referred again recently Last DEXA: never Dentist: twice yearly Ophtho:every  1-2 years Exercise: Since released from PT, [facility] [employer] [facility].  Does online [employer] (YouTube videos), and walking.  PMH, PSH, SH and FH were reviewed and updated Mother had been hospitalized with pneumonia this year.  Outpatient Encounter Medications as of 03/25/2020  Medication Sig  . Calcium Carbonate Antacid (TUMS ULTRA 1000 PO) Take 1 tablet by mouth  daily.  . Cholecalciferol (VITAMIN D3) 125 MCG (5000 UT) TABS Take 1 tablet by mouth daily.  [name] losartan (COZAAR) 25 MG tablet Take 1 tablet (25 mg total) by mouth daily.  . Multiple Vitamins-Minerals (MULTIVITAMIN WITH MINERALS) tablet Take 1 tablet by mouth daily.  . Red Yeast Rice 600 MG TABS Take 1 tablet (600 mg total) by mouth daily.  . [DISCONTINUED] magnesium oxide (MAG-OX) 400 MG tablet Take 400 mg by mouth daily.   No facility-administered encounter medications on file as of 03/25/2020.   No Known Allergies  ROS: The patient denies anorexia, fever, vision changes, decreased hearing, ear pain, sore throat, breast concerns, syncope, dyspnea on exertion, cough, swelling, nausea, vomiting, diarrhea, constipation, abdominal pain, melena, hematochezia, indigestion/heartburn, hematuria, incontinence, dysuria, genital lesions, joint pains, numbness, tingling, weakness, tremor, depression, anxiety, abnormal bleeding/bruising, or enlarged lymph nodes. Denies changes in skin/bowels/moods/energy Noted some hair loss the last few years, gradual thinning. No patches of alopecia. No worsening since menopause. No cycles in 2 years, no spotting.  Slight vaginal discharge (no odor, clear) Hot flashes, night sweats, comes in waves. +vaginal dryness and pain with intercourse.  With hard-intensity cardio or with hot flashes she gets a dull throb in her chest. Palpitations resolved.  Much better than last year.   PHYSICAL EXAM:  Pulse 76   Ht 5\' 7"  (1.702 m)   Wt 143 lb 6.4 oz (65 kg)   LMP 06/08/2018 (Approximate)   BMI 22.46 kg/m    120/94 on the right, 130/94 on the left  Wt Readings from Last 3 Encounters:  03/25/20 143 lb 6.4 oz (65 kg)  08/24/19 137 lb (62.1 kg)  04/25/19 139 lb (63 kg)    General Appearance:  Alert, cooperative, no distress, appears stated age  Head:  Normocephalic, without obvious abnormality, atraumatic  Eyes:  PERRL, conjunctiva/corneas clear, EOM's intact,  fundi benign  Ears:  Normal TM's and external ear canals  Nose: Not examined, wearing mask due to COVID-19 pandemic  Throat: Not examined, wearing mask due to COVID-19 pandemic  Neck: Supple, no lymphadenopathy; thyroid: no enlargement/tenderness/ nodules; no carotid bruit or JVD  Back:  Spine nontender, no curvature, ROM normal, no CVAtenderness  Lungs:  Clear to auscultation bilaterally without wheezes, rales orronchi; respirations unlabored  Chest Wall:    Nontender, no deformity  Heart:  Regular rate and rhythm, S1 and S2 normal, no murmur, rub or gallop  Breast Exam:  No tenderness, masses, or nipple discharge or inversion. No axillary lymphadenopathy.   Abdomen:  Soft, non-tender, nondistended, normoactive bowel sounds,  no masses, no hepatosplenomegaly  Genitalia:  Normal external genitalia without lesions. BUS and vagina normal; No cervical motion tenderness. Uterus and adnexa not enlarged, nontender, no masses. Papnotperformed  Rectal:  Normal sphincter tone, no mass. Heme negative stool.  Extremities: No clubbing, cyanosis or edema. FROM of her shoulders  Pulses: 2+ and symmetric all extremities  Skin: Skin color, texture, turgor normal, no rashes.   Lymph nodes: Cervical, supraclavicular, and axillary nodes normal  Neurologic: CNII-XII intact, normal strength, sensation and gait; reflexes 2+ and symmetric throughout  Psych:  Normal mood, affect, hygiene and grooming   ASSESSMENT/PLAN:  Annual physical exam - Plan: POCT Urinalysis DIP (Proadvantage Device), Follicle Stimulating Hormone, Comprehensive metabolic panel, Lipid panel, CBC with Differential/Platelet, VITAMIN D 25 Hydroxy (Vit-D Deficiency, Fractures), TSH, HIV Antibody (routine testing w rflx), Hepatitis C antibody  Pure hypercholesterolemia - now on red yeast rice, due for recheck, and forward labs to Dr. Meda Coffee - Plan: Lipid panel  Vitamin D  deficiency - continue supplements - Plan: VITAMIN D 25 Hydroxy (Vit-D Deficiency, Fractures)  Essential hypertension - on losartan per Dr. Meda Coffee. BP's normal per home monitor - Plan: Comprehensive metabolic panel  2nd degree AV block - type 1, per cardiology (noted on monitor); asymptomatic  PSVT (paroxysmal supraventricular tachycardia) (HCC)  Need for influenza vaccination - Plan: Flu Vaccine QUAD 6+ mos PF IM (Fluarix Quad PF)  Postmenopausal symptoms - discussed lubrication for vaginal dryness, OTC supplements if needed for hot flashes - Plan: Follicle Stimulating Hormone  Medication monitoring encounter - Plan: Comprehensive metabolic panel, Lipid panel, VITAMIN D 25 Hydroxy (Vit-D Deficiency, Fractures)   FORWARD RESULTS TO DR Meda Coffee   Discussed monthly self breast exams and yearly mammograms; at least 30 minutes of aerobic activity at least 5 days/week, weight-bearing exercise at least 2x/week; proper sunscreen use reviewed; healthy diet, including goals of calcium and vitamin D intake and alcohol recommendations (less than or equal to 1 drink/day) reviewed; regular seatbelt use; changing batteries in smoke detectors. Immunization recommendations--UTD.Continue yearly flu shots; Shingrix age 17.Colonoscopy recommendations reviewed, due, and recently referred.  Pap next year  F/u 1 year, sooner prn.

## 2020-03-24 NOTE — Patient Instructions (Addendum)
°  HEALTH MAINTENANCE RECOMMENDATIONS:  It is recommended that you get at least 30 minutes of aerobic exercise at least 5 days/week (for weight loss, you may need as much as 60-90 minutes). This can be any activity that gets your heart rate up. This can be divided in 10-15 minute intervals if needed, but try and build up your endurance at least once a week.  Weight bearing exercise is also recommended twice weekly.  Eat a healthy diet with lots of vegetables, fruits and fiber.  "Colorful" foods have a lot of vitamins (ie green vegetables, tomatoes, red peppers, etc).  Limit sweet tea, regular sodas and alcoholic beverages, all of which has a lot of calories and sugar.  Up to 1 alcoholic drink daily may be beneficial for women (unless trying to lose weight, watch sugars).  Drink a lot of water.  Calcium recommendations are 1200-1500 mg daily (1500 mg for postmenopausal women or women without ovaries), and vitamin D 1000 IU daily.  This should be obtained from diet and/or supplements (vitamins), and calcium should not be taken all at once, but in divided doses.  Monthly self breast exams and yearly mammograms for women over the age of 41 is recommended.  Sunscreen of at least SPF 30 should be used on all sun-exposed parts of the skin when outside between the hours of 10 am and 4 pm (not just when at beach or pool, but even with exercise, golf, tennis, and yard work!)  Use a sunscreen that says "broad spectrum" so it covers both UVA and UVB rays, and make sure to reapply every 1-2 hours.  Remember to change the batteries in your smoke detectors when changing your clock times in the spring and fall.  Use your seat belt every time you are in a car, and please drive safely and not be distracted with cell phones and texting while driving.  Try using vaginal lubricants prior to intercourse to help with the dryness (coconut oil is also one that may help).

## 2020-03-25 ENCOUNTER — Other Ambulatory Visit: Payer: Self-pay

## 2020-03-25 ENCOUNTER — Ambulatory Visit (INDEPENDENT_AMBULATORY_CARE_PROVIDER_SITE_OTHER): Payer: Commercial Managed Care - PPO | Admitting: Family Medicine

## 2020-03-25 ENCOUNTER — Encounter: Payer: Self-pay | Admitting: Family Medicine

## 2020-03-25 VITALS — BP 120/94 | HR 76 | Ht 67.0 in | Wt 143.4 lb

## 2020-03-25 DIAGNOSIS — Z5181 Encounter for therapeutic drug level monitoring: Secondary | ICD-10-CM

## 2020-03-25 DIAGNOSIS — I471 Supraventricular tachycardia: Secondary | ICD-10-CM

## 2020-03-25 DIAGNOSIS — E7800 Pure hypercholesterolemia, unspecified: Secondary | ICD-10-CM | POA: Diagnosis not present

## 2020-03-25 DIAGNOSIS — Z0000 Encounter for general adult medical examination without abnormal findings: Secondary | ICD-10-CM

## 2020-03-25 DIAGNOSIS — I10 Essential (primary) hypertension: Secondary | ICD-10-CM | POA: Diagnosis not present

## 2020-03-25 DIAGNOSIS — Z23 Encounter for immunization: Secondary | ICD-10-CM | POA: Diagnosis not present

## 2020-03-25 DIAGNOSIS — E559 Vitamin D deficiency, unspecified: Secondary | ICD-10-CM | POA: Diagnosis not present

## 2020-03-25 DIAGNOSIS — N959 Unspecified menopausal and perimenopausal disorder: Secondary | ICD-10-CM

## 2020-03-25 DIAGNOSIS — I441 Atrioventricular block, second degree: Secondary | ICD-10-CM

## 2020-03-25 LAB — POCT URINALYSIS DIP (PROADVANTAGE DEVICE)
Bilirubin, UA: NEGATIVE
Blood, UA: NEGATIVE
Glucose, UA: NEGATIVE mg/dL
Ketones, POC UA: NEGATIVE mg/dL
Leukocytes, UA: NEGATIVE
Nitrite, UA: NEGATIVE
Protein Ur, POC: NEGATIVE mg/dL
Specific Gravity, Urine: 1.005
Urobilinogen, Ur: NEGATIVE
pH, UA: 6 (ref 5.0–8.0)

## 2020-03-26 LAB — LIPID PANEL
Chol/HDL Ratio: 3.4 ratio (ref 0.0–4.4)
Cholesterol, Total: 259 mg/dL — ABNORMAL HIGH (ref 100–199)
HDL: 77 mg/dL (ref >39)
LDL Chol Calc (NIH): 168 mg/dL — ABNORMAL HIGH (ref 0–99)
Triglycerides: 86 mg/dL (ref 0–149)
VLDL Cholesterol Cal: 14 mg/dL (ref 5–40)

## 2020-03-26 LAB — CBC WITH DIFFERENTIAL/PLATELET
Basophils Absolute: 0 10*3/uL (ref 0.0–0.2)
Basos: 1 %
EOS (ABSOLUTE): 0 10*3/uL (ref 0.0–0.4)
Eos: 1 %
Hematocrit: 39.3 % (ref 34.0–46.6)
Hemoglobin: 13 g/dL (ref 11.1–15.9)
Immature Grans (Abs): 0 10*3/uL (ref 0.0–0.1)
Immature Granulocytes: 0 %
Lymphocytes Absolute: 1.6 10*3/uL (ref 0.7–3.1)
Lymphs: 29 %
MCH: 29.5 pg (ref 26.6–33.0)
MCHC: 33.1 g/dL (ref 31.5–35.7)
MCV: 89 fL (ref 79–97)
Monocytes Absolute: 0.4 10*3/uL (ref 0.1–0.9)
Monocytes: 7 %
Neutrophils Absolute: 3.4 10*3/uL (ref 1.4–7.0)
Neutrophils: 62 %
Platelets: 292 10*3/uL (ref 150–450)
RBC: 4.41 x10E6/uL (ref 3.77–5.28)
RDW: 12 % (ref 11.7–15.4)
WBC: 5.5 10*3/uL (ref 3.4–10.8)

## 2020-03-26 LAB — HIV ANTIBODY (ROUTINE TESTING W REFLEX): HIV Screen 4th Generation wRfx: NONREACTIVE

## 2020-03-26 LAB — COMPREHENSIVE METABOLIC PANEL
ALT: 15 IU/L (ref 0–32)
AST: 30 IU/L (ref 0–40)
Albumin/Globulin Ratio: 2 (ref 1.2–2.2)
Albumin: 5.1 g/dL — ABNORMAL HIGH (ref 3.8–4.8)
Alkaline Phosphatase: 81 IU/L (ref 44–121)
BUN/Creatinine Ratio: 13 (ref 9–23)
BUN: 10 mg/dL (ref 6–24)
Bilirubin Total: 0.4 mg/dL (ref 0.0–1.2)
CO2: 24 mmol/L (ref 20–29)
Calcium: 9.9 mg/dL (ref 8.7–10.2)
Chloride: 101 mmol/L (ref 96–106)
Creatinine, Ser: 0.8 mg/dL (ref 0.57–1.00)
GFR calc Af Amer: 100 mL/min/{1.73_m2} (ref >59)
GFR calc non Af Amer: 87 mL/min/{1.73_m2} (ref >59)
Globulin, Total: 2.5 g/dL (ref 1.5–4.5)
Glucose: 89 mg/dL (ref 65–99)
Potassium: 4.2 mmol/L (ref 3.5–5.2)
Sodium: 138 mmol/L (ref 134–144)
Total Protein: 7.6 g/dL (ref 6.0–8.5)

## 2020-03-26 LAB — TSH: TSH: 1.18 u[IU]/mL (ref 0.450–4.500)

## 2020-03-26 LAB — VITAMIN D 25 HYDROXY (VIT D DEFICIENCY, FRACTURES): Vit D, 25-Hydroxy: 45 ng/mL (ref 30.0–100.0)

## 2020-03-26 LAB — FOLLICLE STIMULATING HORMONE: FSH: 64.8 m[IU]/mL

## 2020-03-26 LAB — HEPATITIS C ANTIBODY: Hep C Virus Ab: <0.1 s/co ratio (ref 0.0–0.9)

## 2020-04-30 LAB — HM COLONOSCOPY

## 2020-05-02 ENCOUNTER — Encounter: Payer: Self-pay | Admitting: Family Medicine

## 2020-05-23 NOTE — Progress Notes (Signed)
Cardiology Office Note    Date:  05/24/2020   ID:  Martha Lindau, DOB March 01, 1971, MRN [id number]  PCP:  Rita Ohara, MD  Cardiologist:  Ena Dawley, MD  Electrophysiologist:  None   Chief Complaint: f/u family history of CAD and risk factors  History of Present Barton:   Martha Barton is a 49 y.o. female with history of hyperlipidemia, borderline HTN, PSVT, second degree type 1 AV block, and family history of CAD who is here for annual follow-up.  She has very strong family history of premature coronary artery disease. Her father had first myocardial infarction in his 40s and bypass at age of 35.  Both of her grandfathers also underwent bypass surgery.  She has been recently diagnosed with hypertension on several occasions however have not been starting her medications. Her LDL has always been elevated despite effort in her diet and exercise. She exercises regularly. She has a history of chest pain with mixed features - this occurs with hot flashes and with extreme exertion. Fortunately coronary CTA 04/14/19 was normal. Event monitor showed sinus bradycardia to sinus tachycardia with 6 very short runs of SVT, one episode of second degree Mobitz type 1 AV block during sleeping hours; Dr. Meda Coffee recommended no change in therapy at that time. Her chest discomfort correlated primarily to NSR. I met her in 04/2019 at which time we added low dose amlodipine for borderline HTN and potential vasospasm. 2D echo was normal at that time. In 08/2019 she saw Dr. Meda Coffee for a telemedicine visit and had broken her arm. The patient was concerned that amlodipine may be hindering her healing so she was switched to losartan instead.  She returns back for follow-up today feeling similar to prior without major change. Still gets chest pressure with hot flashes or extreme exertion. She has rare skip/flip palpitations but no sustained events. She feels she did not trial the amlodipine long enough  to know if it made a difference since she was exercising less around that time. The losartan has not impacted these symptoms. We also discussed her recent lipid levels which were obtained even with red yeast rice on board.   Labwork independently reviewed: 03/2020 TSH wnl, Hgb 13, LDL 168 (H), TChol 259, trig 86, HLD 77, Cr 0.80, LFTS ok   Past Medical History:  Diagnosis Date  . Irregular menses   . Left ulnar fracture 06/22/2019   acute traumatic left ulna nightstick fracture, non-displaced  . Menstrual headache   . Mobitz type 1 second degree atrioventricular block   . PSVT (paroxysmal supraventricular tachycardia) ([place])   . Pure hypercholesterolemia 09/02/2012   Lab Results  Component Value Date   CHOL 246* 09/01/2012   HDL 75 09/01/2012   LDLCALC 154* 09/01/2012   TRIG 87 09/01/2012   CHOLHDL 3.3 09/01/2012      . Unspecified vitamin D deficiency     Past Surgical History:  Procedure Laterality Date  . APPENDECTOMY      Current Medications: Current Meds  Medication Sig  . Calcium Carbonate Antacid (TUMS ULTRA 1000 PO) Take 1 tablet by mouth daily.  . Cholecalciferol (VITAMIN D3) 125 MCG (5000 UT) TABS Take 1 tablet by mouth daily.  [name] losartan (COZAAR) 25 MG tablet Take 1 tablet (25 mg total) by mouth daily.  . Multiple Vitamins-Minerals (MULTIVITAMIN WITH MINERALS) tablet Take 2 tablets by mouth daily.   . Red Yeast Rice 600 MG TABS Take Two tablets by mouth (600 mg total) daily  Allergies:   Patient has no known allergies.   Social History   Socioeconomic History  . Marital status: Married    Spouse name: Not on file  . Number of children: Not on file  . Years of education: Not on file  . Highest education level: Not on file  Occupational History  . Occupation: homemaker  Tobacco Use  . Smoking status: Never Smoker  . Smokeless tobacco: Never Used  Vaping Use  . Vaping Use: Never used  Substance and Sexual Activity  . Alcohol use: Yes    Comment: 1/week  .  Drug use: No  . Sexual activity: Yes    Partners: Male    Birth control/protection: Condom  Other Topics Concern  . Not on file  Social History Narrative   Agricultural engineer (former Pharmacist, hospital).  Lives with husband and 2 children (46 yo son, 22 yo daughter), 1 dog.      Updated 03/2020   Social Determinants of Health   Financial Resource Strain:   . Difficulty of Paying Living Expenses: Not on file  Food Insecurity:   . Worried About Charity fundraiser in the Last Year: Not on file  . Ran Out of Food in the Last Year: Not on file  Transportation Needs:   . Lack of Transportation (Medical): Not on file  . Lack of Transportation (Non-Medical): Not on file  Physical Activity:   . Days of Exercise per Week: Not on file  . Minutes of Exercise per Session: Not on file  Stress:   . Feeling of Stress : Not on file  Social Connections:   . Frequency of Communication with Friends and Family: Not on file  . Frequency of Social Gatherings with Friends and Family: Not on file  . Attends Religious Services: Not on file  . Active Member of Clubs or Organizations: Not on file  . Attends Archivist Meetings: Not on file  . Marital Status: Not on file     Family History:  The patient's family history includes Allergies in her mother; Breast cancer (age of onset: 16) in her maternal aunt; Cancer in her father, maternal grandmother, and paternal grandfather; Colon cancer (age of onset: 60) in her father; Coronary artery disease in her father and maternal aunt; Diabetes in her father; Heart disease in her maternal grandfather and paternal grandfather; Hyperlipidemia in her mother; Hypertension in her father; Osteoporosis in her maternal grandmother and mother.  ROS:   Please see the history of present Barton. Otherwise, review of systems is positive for noticing HR tends to be generally elevated with exercise for years. All other systems are reviewed and otherwise negative.    EKGs/Labs/Other  Studies Reviewed:    Studies reviewed are outlined and summarized above. Reports included below if pertinent.  2D Echo 04/2019 1. Left ventricular ejection fraction, by visual estimation, is 55 to  60%. The left ventricle has normal function. There is no left ventricular  hypertrophy.  2. Global right ventricle has normal systolic function.The right  ventricular size is normal. No increase in right ventricular wall  thickness.  3. Left atrial size was normal.  4. Right atrial size was normal.  5. The mitral valve is normal in structure. No evidence of mitral valve  regurgitation.  6. The tricuspid valve is normal in structure. Tricuspid valve  regurgitation is trivial.  7. The aortic valve is normal in structure. Aortic valve regurgitation is  not visualized.  8. The pulmonic valve was normal in  structure. Pulmonic valve  regurgitation is not visualized.  9. The atrial septum is grossly normal.   Cor CT 03/2019 EXAM: Cardiac/Coronary  CTA  TECHNIQUE: The patient was scanned on a [employer].  FINDINGS: A 100 kV prospective scan was triggered in the descending thoracic aorta at 111 HU's. Axial non-contrast 3 mm slices were carried out through the heart. The data set was analyzed on a dedicated work station and scored using the [place]. Gantry rotation speed was 250 msecs and collimation was .6 mm. No beta blockade and 0.8 mg of sl NTG was given. The 3D data set was reconstructed in 5% intervals of the 67-82 % of the R-R cycle. Diastolic phases were analyzed on a dedicated work station using MPR, MIP and VRT modes. The patient received 80 cc of contrast.  Aorta:  Normal size.  No calcifications.  No dissection.  Aortic Valve:  Trileaflet.  No calcifications.  Coronary Arteries:  Normal coronary origin.  Right dominance.  RCA is a large dominant artery that gives rise to PDA and PLA. There is no plaque.  Left main is a large artery that  gives rise to LAD and LCX arteries. Left main has no plaque.  LAD is a large vessel that gives rise to one diagonal artery and has no plaque.  LCX is a non-dominant artery that gives rise to one large OM1 branch. There is no plaque.  Other findings:  Normal pulmonary vein drainage into the left atrium.  Normal left atrial appendage without a thrombus.  Normal size of the pulmonary artery.  IMPRESSION: 1. Coronary calcium score of 0. This was 0 percentile for age and sex matched control.  2. Normal coronary origin with right dominance.  3. CAD-RADS 0. No evidence of CAD (0%). Consider non-atherosclerotic causes of chest pain.   Electronically Signed   By: Ena Dawley   On: 04/14/2019 12:47   IMPRESSION: No significant incidental noncardiac findings are noted.  Electronically Signed: By: Vinnie Langton M.D. On: 04/14/2019 08:54    EKG:  EKG is ordered today, personally reviewed, demonstrating NSR 60bpm, nonspecific STT changes similar to prior  Recent Labs: 03/25/2020: ALT 15; BUN 10; Creatinine, Ser 0.80; Hemoglobin 13.0; Platelets 292; Potassium 4.2; Sodium 138; TSH 1.180  Recent Lipid Panel    Component Value Date/Time   CHOL 259 (H) 03/25/2020 1124   TRIG 86 03/25/2020 1124   HDL 77 03/25/2020 1124   CHOLHDL 3.4 03/25/2020 1124   CHOLHDL 2.9 02/12/2016 0955   VLDL 17 02/12/2016 0955   LDLCALC 168 (H) 03/25/2020 1124    PHYSICAL EXAM:    VS:  BP 110/70   Pulse 60   Ht _0  (1.702 m)   Wt 143 lb 12.8 oz (65.2 kg)   LMP 06/08/2018 (Approximate)   SpO2 98%   BMI 22.52 kg/m   BMI: Body mass index is 22.52 kg/m.  GEN: Well nourished, well developed WF, in no acute distress HEENT: normocephalic, atraumatic Neck: no JVD, carotid bruits, or masses Cardiac: RRR; no murmurs, rubs, or gallops, no edema  Respiratory:  clear to auscultation bilaterally, normal work of breathing GI: soft, nontender, nondistended, + BS MS: no deformity  or atrophy Skin: warm and dry, no rash Neuro:  Alert and Oriented x 3, Strength and sensation are intact, follows commands Psych: euthymic mood, full affect  Wt Readings from Last 3 Encounters:  05/24/20 143 lb 12.8 oz (65.2 kg)  03/25/20 143 lb 6.4 oz (65 kg)  08/24/19 137 lb (62.1 kg)     ASSESSMENT & PLAN:   1. Hyperlipidemia in the context of family history of CAD: LDL remains poorly controlled despite red yeast rice.  At this point given her family history of coronary disease I would recommend formal therapeutic with statin therapy.  We will stop red yeast rice and start rosuvastatin 10 mg daily with recheck CMET/lipid profile in 3 months.  2. Other chest pain: Ongoing finding for patient without any major cardiac abnormalities to explain this.  Recent 2D echocardiogram was normal.  Coronary CTA did not show any evidence of coronary artery disease.  Event monitor did not show any arrhythmias associated with these episodes.  Microvascular angina could be a diagnosis of exclusion.  Per our shared decision making, she would like to trial going back to amlodipine to see if this makes a difference.  We will stop losartan and restart amlodipine 2.5 mg daily.  She will give [place] an update in about a month how she is doing.  Trial of Imdur would be another option if she sees no benefit with amlodipine.  3. Borderline HTN: Follow with changes as outlined above.  Blood pressure is well controlled today.  Parameters were given for monitoring at home.  4. PSVT: No sustained episodes.  Would avoid beta-blocker therapy given #5. She did note that over Thanksgiving she checked her BP on her dad's cuff and it signaled possible irregular heartbeat. She was not having symptoms otherwise at that time. She has done well since then. EKG shows NSR today. Discussed use of Jodelle Red as an option. Can consider repeat event monitor for recurrent episodes.  5. Second degree AVB type 1: Noted during sleeping hours on  previous event monitor, not of any hemodynamic significance at this time.  EKG today is unchanged from prior.  Disposition: F/u with Dr. Meda Coffee in 1 year.  Medication Adjustments/Labs and Tests Ordered: Current medicines are reviewed at length with the patient today.  Concerns regarding medicines are outlined above. Medication changes, Labs and Tests ordered today are summarized above and listed in the Patient Instructions accessible in Encounters.   Signed, Charlie Pitter, PA-C  05/24/2020 9:36 AM    [facility] [address], [place], [place]  [address] Phone: [phone]; Fax: [phone]

## 2020-05-24 ENCOUNTER — Other Ambulatory Visit: Payer: Self-pay

## 2020-05-24 ENCOUNTER — Ambulatory Visit: Payer: Commercial Managed Care - PPO | Admitting: Physician Assistant

## 2020-05-24 ENCOUNTER — Encounter: Payer: Self-pay | Admitting: Physician Assistant

## 2020-05-24 VITALS — BP 110/70 | HR 60 | Ht 67.0 in | Wt 143.8 lb

## 2020-05-24 DIAGNOSIS — R030 Elevated blood-pressure reading, without diagnosis of hypertension: Secondary | ICD-10-CM | POA: Diagnosis not present

## 2020-05-24 DIAGNOSIS — I471 Supraventricular tachycardia: Secondary | ICD-10-CM | POA: Diagnosis not present

## 2020-05-24 DIAGNOSIS — I441 Atrioventricular block, second degree: Secondary | ICD-10-CM

## 2020-05-24 DIAGNOSIS — E785 Hyperlipidemia, unspecified: Secondary | ICD-10-CM | POA: Diagnosis not present

## 2020-05-24 DIAGNOSIS — R0789 Other chest pain: Secondary | ICD-10-CM | POA: Diagnosis not present

## 2020-05-24 MED ORDER — AMLODIPINE BESYLATE 2.5 MG PO TABS: 2.5000 mg | ORAL_TABLET | Freq: Every day | ORAL | 3 refills | Status: DC

## 2020-05-24 MED ORDER — ROSUVASTATIN CALCIUM 10 MG PO TABS: 10.0000 mg | ORAL_TABLET | Freq: Every day | ORAL | 3 refills | Status: DC

## 2020-05-24 NOTE — Patient Instructions (Addendum)
    Medication Instructions:  Your physician has recommended you make the following change in your medication:  1.  STOP Losartan 2.  STOP Red Yeast Rice 3.  START Amlodipine 2.5 mg taking 1 daily 4.  START Rosuvastatin 10 mg taking 1 daily   *If you need a refill on your cardiac medications before your next appointment, please call your pharmacy*   Lab Work: 08/23/2020:  COME ANYTIME AFTER 7:30 A.M., MAKE SURE FASTING, NOTHING TO EAT OR DRINK AFTER MIDNIGHT THE NIGHT BEFORE:  CMET & LIPID  If you have labs (blood work) drawn today and your tests are completely normal, you will receive your results only by: [name] MyChart Message (if you have MyChart) OR . A paper copy in the mail If you have any lab test that is abnormal or we need to change your treatment, we will call you to review the results.   Testing/Procedures: None ordered   Follow-Up: At [facility], you and your health needs are our priority.  As part of our continuing mission to provide you with exceptional heart care, we have created designated Provider Care Teams.  These Care Teams include your primary Cardiologist (physician) and Advanced Practice Providers (APPs -  Physician Assistants and Nurse Practitioners) who all work together to provide you with the care you need, when you need it.  We recommend signing up for the patient portal called "MyChart".  Sign up information is provided on this After Visit Summary.  MyChart is used to connect with patients for Virtual Visits (Telemedicine).  Patients are able to view lab/test results, encounter notes, upcoming appointments, etc.  Non-urgent messages can be sent to your provider as well.   To learn more about what you can do with MyChart, go to [email or web address].    Your next appointment:   12 month(s)  The format for your next appointment:   In Person  Provider:   You may see Ena Dawley, MD or one of the following Advanced Practice Providers on your  designated Care Team:    Melina Copa, PA-C  Ermalinda Barrios, PA-C    Other Instructions Please monitor your blood pressure occasionally at home. Call your doctor if you tend to get readings of greater than 130 on the top number or 80 on the bottom number. Also please call if you notice any dizziness, lightheadedness, or low blood pressure readings (less than 105 on the top number).  Please give [place] a call or MyChart message in about a month to let us know how you are doing on your new regimen.  The name of the EKG app is called Kardia.

## 2020-08-23 ENCOUNTER — Other Ambulatory Visit: Payer: Commercial Managed Care - PPO | Admitting: *Deleted

## 2020-08-23 ENCOUNTER — Other Ambulatory Visit: Payer: Self-pay

## 2020-08-23 DIAGNOSIS — E785 Hyperlipidemia, unspecified: Secondary | ICD-10-CM

## 2020-08-23 DIAGNOSIS — I441 Atrioventricular block, second degree: Secondary | ICD-10-CM

## 2020-08-23 DIAGNOSIS — I471 Supraventricular tachycardia: Secondary | ICD-10-CM

## 2020-08-23 DIAGNOSIS — R030 Elevated blood-pressure reading, without diagnosis of hypertension: Secondary | ICD-10-CM

## 2020-08-23 LAB — COMPREHENSIVE METABOLIC PANEL
ALT: 28 IU/L (ref 0–32)
AST: 28 IU/L (ref 0–40)
Albumin/Globulin Ratio: 2.5 — ABNORMAL HIGH (ref 1.2–2.2)
Albumin: 4.9 g/dL — ABNORMAL HIGH (ref 3.8–4.8)
Alkaline Phosphatase: 67 IU/L (ref 44–121)
BUN/Creatinine Ratio: 15 (ref 9–23)
BUN: 12 mg/dL (ref 6–24)
Bilirubin Total: 0.4 mg/dL (ref 0.0–1.2)
CO2: 24 mmol/L (ref 20–29)
Calcium: 9.7 mg/dL (ref 8.7–10.2)
Chloride: 101 mmol/L (ref 96–106)
Creatinine, Ser: 0.81 mg/dL (ref 0.57–1.00)
Globulin, Total: 2 g/dL (ref 1.5–4.5)
Glucose: 88 mg/dL (ref 65–99)
Potassium: 4.3 mmol/L (ref 3.5–5.2)
Sodium: 139 mmol/L (ref 134–144)
Total Protein: 6.9 g/dL (ref 6.0–8.5)
eGFR: 89 mL/min/{1.73_m2} (ref >59)

## 2020-08-23 LAB — LIPID PANEL
Chol/HDL Ratio: 1.9 ratio (ref 0.0–4.4)
Cholesterol, Total: 148 mg/dL (ref 100–199)
HDL: 76 mg/dL (ref >39)
LDL Chol Calc (NIH): 59 mg/dL (ref 0–99)
Triglycerides: 62 mg/dL (ref 0–149)
VLDL Cholesterol Cal: 13 mg/dL (ref 5–40)

## 2020-08-27 ENCOUNTER — Telehealth: Payer: Self-pay | Admitting: Cardiology

## 2020-08-27 NOTE — Telephone Encounter (Signed)
Returned call to pt and she has been made aware of her lab results. See result note.

## 2020-08-27 NOTE — Telephone Encounter (Signed)
Pt called in returning call to [facility] for lab results   Best number [phone]  Anytime before 11:15 and after 1:15pm

## 2020-12-02 ENCOUNTER — Other Ambulatory Visit: Payer: Self-pay | Admitting: Family Medicine

## 2020-12-02 DIAGNOSIS — Z1231 Encounter for screening mammogram for malignant neoplasm of breast: Secondary | ICD-10-CM

## 2020-12-04 ENCOUNTER — Ambulatory Visit
Admission: RE | Admit: 2020-12-04 | Discharge: 2020-12-04 | Disposition: A | Discharge status: Home / Self Care | Payer: Commercial Managed Care - PPO | Source: Ambulatory Visit

## 2020-12-04 ENCOUNTER — Other Ambulatory Visit: Payer: Self-pay

## 2020-12-04 DIAGNOSIS — Z1231 Encounter for screening mammogram for malignant neoplasm of breast: Secondary | ICD-10-CM

## 2021-04-01 NOTE — Progress Notes (Signed)
Chief Complaint  Patient presents with   Annual Exam    Fasting annual exam with pap. Will schedule eye exam. Had flu and covid booster 10/3-too early for shingrix. Still experiencing chest discomfort-saw cardiology but they just said no cardiac issues from the tests so follow up with PCP. No explanation for her chest discomfort.     Martha Barton is a 50 y.o. female who presents for a complete physical.  She has the following concerns:  She is under the care of cardiologist, referred when having left sided chest pain, palpitations and some elevated blood pressures. She had Zio patch, CT calcium score and echo.  The monitor showed sinus bradycardia to sinus tachycardia. 6 very short runs of SVT, one episode of second degree Mobitz type AV block during sleeping hours. Normal coronary CTA with no evidence for CAD (calcium score of 0). She was prescribed amlodipine 2.69m in 04/2019.  She never really took this (patient had concerns about bone healing from fracture), no side effects, and was changed to losartan in 08/2019.  Red yeast rice was also started by Dr. NMeda Coffee In 05/2020 cardiology started her on Crestor 144m(failed trial of RYR, concerns regarding her strong FH--LDL was up to 168).  She was also started on amlodipine 2.73m473mor some chest pain and borderline BP's. She was noted previously to be bradycardic at night, recommended avoiding AV nodal acting agents. Denies side effects from amlodipine and rosuvastatin. She only very rarely gets any fluttering or pounding in her chest. BP's at home are running 115-120/70-80, occasionally higher.  Recheck of lipids in March were at goal: Lab Results  Component Value Date   CHOL 148 08/23/2020   HDL 76 08/23/2020   LDLCALC 59 08/23/2020   TRIG 62 08/23/2020   CHOLHDL 1.9 08/23/2020   She still gets some chest discomfort, perhaps only slightly better since being on the amlodipine.  She notices it when she gets hot--with every hot  flash, in a hot shower, and when she is doing cardio, sometimes even with walking.  Pain is always on the left side, not tender to touch.   Postmenopausal, no cycle since the end of 2019. +hot flashes and night sweats, fluctuates (worse every 6 weeks), a little iimproved in frequency and severity.  She does note some vaginal dryness and dyspareunia. Has tried a lubricant, only helped a little.   Vitamin D deficiency:  Level was low at 26 in 2014.  Last level was normal at 45 in 03/2020, when taking MVI gummy daily, as well as 1000 IU of Vitamin D daily. She currently still takes both supplements.   Immunization History  Administered Date(s) Administered   Influenza Split 05/08/2009, 05/09/2010   Influenza,inj,Quad PF,6+ Mos 02/12/2016, 02/20/2019, 03/25/2020   Influenza-Unspecified 03/24/2021   PFIZER(Purple Top)SARS-COV-2 Vaccination 09/10/2019, 10/01/2019, 07/08/2020   Pfizer Covid-19 Vaccine Bivalent Booster [id number] 03/24/2021   Td 06/22/2005   Tdap 02/12/2016   Last Pap smear: 01/2016 normal, no high risk HPV detected Last mammogram: 11/2020 Last colonoscopy: 04/2020, Dr. HungBenson [place]iverticulosis Last DEXA: never Dentist: twice yearly Ophtho:  every 1-2 years, due Exercise:  [facility]--gets weight-bearing exercise in her classes (4x/week). Also does some walking.   PMH, PSH, SH and FH were reviewed and updated Father passed away this past year (stopped mid-sentence while talking to his wife).   Outpatient Encounter Medications as of 04/02/2021  Medication Sig   amLODipine (NORVASC) 2.5 MG tablet Take 1 tablet (2.5  mg total) by mouth daily.   Calcium Carbonate Antacid (TUMS ULTRA 1000 PO) Take 1 tablet by mouth daily.   Cholecalciferol (VITAMIN D3) 125 MCG (5000 UT) TABS Take 1 tablet by mouth daily.   Multiple Vitamins-Minerals (MULTIVITAMIN WITH MINERALS) tablet Take 2 tablets by mouth daily.    rosuvastatin (CRESTOR) 10 MG tablet Take 1 tablet (10 mg total) by  mouth daily.   No facility-administered encounter medications on file as of 04/02/2021.   No Known Allergies   ROS:  The patient denies anorexia, fever, vision changes, decreased hearing, ear pain, sore throat, breast concerns, syncope, dyspnea on exertion, cough, swelling, nausea, vomiting, diarrhea, constipation, abdominal pain, melena, hematochezia, indigestion/heartburn, hematuria, incontinence, dysuria, genital lesions, joint pains, numbness, tingling, weakness, tremor, depression, anxiety, abnormal bleeding/bruising, or enlarged lymph nodes. Denies changes in skin/bowels/moods/energy Noted some hair loss the last few years, gradual thinning. No patches of alopecia. No worsening since menopause. Denies vaginal bleeding, discharge, odor, itch. +vaginal dryness and pain with intercourse. Hot flashes, night sweats, comes in waves, overall just slightly better than last year.  Left sided chest pain per HPI. No further palpitations (very rare).   PHYSICAL EXAM:  BP 120/60   Pulse 68   Ht 5' 7"  (1.702 m)   Wt 146 lb (66.2 kg)   LMP 06/08/2018 (Approximate)   BMI 22.87 kg/m    Wt Readings from Last 3 Encounters:  04/02/21 146 lb (66.2 kg)  05/24/20 143 lb 12.8 oz (65.2 kg)  03/25/20 143 lb 6.4 oz (65 kg)    General Appearance:    Alert, cooperative, no distress, appears stated age  Head:    Normocephalic, without obvious abnormality, atraumatic  Eyes:    PERRL, conjunctiva/corneas clear, EOM's intact, fundi benign  Ears:    Normal TM's and external ear canals  Nose:   Not examined, wearing mask due to COVID-19 pandemic  Throat:   Not examined, wearing mask due to COVID-19 pandemic  Neck:   Supple, no lymphadenopathy;  thyroid:  no enlargement/ tenderness/nodules; no carotid bruit or JVD  Back:    Spine nontender, no curvature, ROM normal, no CVA tenderness  Lungs:     Clear to auscultation bilaterally without wheezes, rales or ronchi; respirations unlabored  Chest Wall:     Nontender, no deformity   Heart:    Regular rate and rhythm, S1 and S2 normal, no murmur, rub   or gallop  Breast Exam:    No tenderness, masses, or nipple discharge or inversion.  No axillary lymphadenopathy.   Abdomen:     Soft, non-tender, nondistended, normoactive bowel sounds,    no masses, no hepatosplenomegaly  Genitalia:    Normal external genitalia without lesions.  BUS and vagina normal;  No cervical motion tenderness, no cervical lesions or discharge.  Uterus and adnexa not enlarged, nontender, no masses.  Pap performed  Rectal:    Normal sphincter tone, no mass.  Heme negative stool.  Extremities:   No clubbing, cyanosis or edema.   Pulses:   2+ and symmetric all extremities  Skin:   Skin color, texture, turgor normal, no rashes.   Lymph nodes:   Cervical, supraclavicular, and axillary nodes normal  Neurologic:   Normal strength, sensation and gait; reflexes 2+ and symmetric throughout                Psych:   Normal mood, affect, hygiene and grooming   ASSESSMENT/PLAN:  Annual physical exam - Plan: POCT Urinalysis DIP (Proadvantage Device), Cytology -  PAP([place])  Pure hypercholesterolemia - at goal per recheck in 08/2020, on statin per cardiologist  Essential hypertension - and chest pain, on amlodipine per cardiologist.  BP is well controlled; still has some CP and plans to f/u with cardiology  Vitamin D deficiency - cont current supplements  PSVT (paroxysmal supraventricular tachycardia) ([place]) - no longer having symptoms, since on amlodipine  Postmenopausal symptoms - hot flashes/night sweats are tolerable.  Vaginal dryness/dyspareunia--reviewed lubricants, and option for vaginal estrogen if ineffective  Will need shingrix (to return for NV) Pap today  Labs not needed--she will be getting labs through her husband's job tomorrow. Had lipids and c-met in 08/2020 by cardiologist. She is due to f/u with cardiology in a few months. (Meds will expire by the end of the  year).  Discussed monthly self breast exams and yearly mammograms; at least 30 minutes of aerobic activity at least 5 days/week, weight-bearing exercise at least 2x/week; proper sunscreen use reviewed; healthy diet, including goals of calcium and vitamin D intake and alcohol recommendations (less than or equal to 1 drink/day) reviewed; regular seatbelt use; changing batteries in smoke detectors.  Immunization recommendations--UTD.  Shingrix risks/SE reviewed, encouraged her to schedule NV when convenient (when >2 weeks from recent vaccines). Colonoscopy UTD.   F/u 1 year, sooner prn.

## 2021-04-01 NOTE — Patient Instructions (Addendum)
  HEALTH MAINTENANCE RECOMMENDATIONS:  It is recommended that you get at least 30 minutes of aerobic exercise at least 5 days/week (for weight loss, you may need as much as 60-90 minutes). This can be any activity that gets your heart rate up. This can be divided in 10-15 minute intervals if needed, but try and build up your endurance at least once a week.  Weight bearing exercise is also recommended twice weekly.  Eat a healthy diet with lots of vegetables, fruits and fiber.  "Colorful" foods have a lot of vitamins (ie green vegetables, tomatoes, red peppers, etc).  Limit sweet tea, regular sodas and alcoholic beverages, all of which has a lot of calories and sugar.  Up to 1 alcoholic drink daily may be beneficial for women (unless trying to lose weight, watch sugars).  Drink a lot of water.  Calcium recommendations are 1200-1500 mg daily (1500 mg for postmenopausal women or women without ovaries), and vitamin D 1000 IU daily.  This should be obtained from diet and/or supplements (vitamins), and calcium should not be taken all at once, but in divided doses.  Monthly self breast exams and yearly mammograms for women over the age of 54 is recommended.  Sunscreen of at least SPF 30 should be used on all sun-exposed parts of the skin when outside between the hours of 10 am and 4 pm (not just when at beach or pool, but even with exercise, golf, tennis, and yard work!)  Use a sunscreen that says "broad spectrum" so it covers both UVA and UVB rays, and make sure to reapply every 1-2 hours.  Remember to change the batteries in your smoke detectors when changing your clock times in the spring and fall. Carbon monoxide detectors are recommended for your home.  Use your seat belt every time you are in a car, and please drive safely and not be distracted with cell phones and texting while driving.  You will be due for a follow up visit with the cardiologist in December (your medications will only last through  the end of the year). Please contact their office to schedule for late December or early January.  Dr. Meda Coffee is no longer there, but they can schedule you with someone else.  I recommend getting the new shingles vaccine (Shingrix). You may want to check with your insurance to verify what your out of pocket cost may be (usually covered as preventative, but better to verify to avoid any surprises, as this vaccine is expensive), and then schedule a nurse visit at our office when convenient (based on the possible side effects as discussed).   This is a series of 2 injections, spaced 2 months apart.  It doesn't have to be exactly 2 months apart (but can't be sooner), if that isn't feasible for your schedule, but try and get them close to 2 months (and definitely within 6 months of each other, or else the efficacy of the vaccine drops off). This should be separated from other vaccines by at least 2 weeks.  You may want to try different types of lubricants--KY, coconut oil (cooking oil), or Replens Vaginal Gel.

## 2021-04-02 ENCOUNTER — Ambulatory Visit: Payer: Commercial Managed Care - PPO | Admitting: Family Medicine

## 2021-04-02 ENCOUNTER — Other Ambulatory Visit (HOSPITAL_COMMUNITY)
Admission: RE | Admit: 2021-04-02 | Discharge: 2021-04-02 | Disposition: A | Discharge status: Home / Self Care | Payer: Commercial Managed Care - PPO | Source: Ambulatory Visit | Attending: Family Medicine | Admitting: Family Medicine

## 2021-04-02 ENCOUNTER — Other Ambulatory Visit: Payer: Self-pay

## 2021-04-02 ENCOUNTER — Encounter: Payer: Self-pay | Admitting: Family Medicine

## 2021-04-02 VITALS — BP 120/60 | HR 68 | Ht 67.0 in | Wt 146.0 lb

## 2021-04-02 DIAGNOSIS — Z0000 Encounter for general adult medical examination without abnormal findings: Secondary | ICD-10-CM | POA: Insufficient documentation

## 2021-04-02 DIAGNOSIS — I10 Essential (primary) hypertension: Secondary | ICD-10-CM

## 2021-04-02 DIAGNOSIS — E559 Vitamin D deficiency, unspecified: Secondary | ICD-10-CM

## 2021-04-02 DIAGNOSIS — I471 Supraventricular tachycardia: Secondary | ICD-10-CM

## 2021-04-02 DIAGNOSIS — E7800 Pure hypercholesterolemia, unspecified: Secondary | ICD-10-CM | POA: Diagnosis not present

## 2021-04-02 DIAGNOSIS — N959 Unspecified menopausal and perimenopausal disorder: Secondary | ICD-10-CM

## 2021-04-02 LAB — POCT URINALYSIS DIP (PROADVANTAGE DEVICE)
Bilirubin, UA: NEGATIVE
Blood, UA: NEGATIVE
Glucose, UA: NEGATIVE mg/dL
Ketones, POC UA: NEGATIVE mg/dL
Nitrite, UA: NEGATIVE
Protein Ur, POC: NEGATIVE mg/dL
Specific Gravity, Urine: 1.015
Urobilinogen, Ur: NEGATIVE
pH, UA: 5.5 (ref 5.0–8.0)

## 2021-04-07 LAB — CYTOLOGY - PAP
Comment: NEGATIVE
Diagnosis: NEGATIVE
High risk HPV: NEGATIVE

## 2021-04-10 ENCOUNTER — Other Ambulatory Visit (INDEPENDENT_AMBULATORY_CARE_PROVIDER_SITE_OTHER): Payer: Commercial Managed Care - PPO

## 2021-04-10 ENCOUNTER — Other Ambulatory Visit: Payer: Self-pay

## 2021-04-10 DIAGNOSIS — Z23 Encounter for immunization: Secondary | ICD-10-CM

## 2021-06-02 ENCOUNTER — Other Ambulatory Visit: Payer: Self-pay | Admitting: Physician Assistant

## 2021-08-11 NOTE — Progress Notes (Deleted)
Cardiology Office Note:    Date:  08/11/2021   ID:  Martha Barton, DOB [birth year], MRN [id number]  PCP:  Rita Ohara, MD   [facility] Providers Cardiologist:  Ena Dawley, MD {   Referring MD: Rita Ohara, MD     History of Present Illness:    Martha Barton is a 51 y.o. female with a hx of hyperlipidemia, borderline HTN, PSVT, second degree type 1 AV block, and family history of CAD who was previously followed by Dr. Meda Coffee who now presents to clinic for follow-up.  Per review of the record, the patient has very strong family history of premature coronary artery disease. Her father had first myocardial infarction in his 70s and bypass at age of 51.  Both of her grandfathers also underwent bypass surgery.  She has been recently diagnosed with hypertension on several occasions however have not been starting her medications. Her LDL has always been elevated despite effort in her diet and exercise. She exercises regularly. She has a history of chest pain with mixed features - this occurs with hot flashes and with extreme exertion. Fortunately coronary CTA 04/14/19 was normal. Event monitor showed sinus bradycardia to sinus tachycardia with 6 very short runs of SVT, one episode of second degree Mobitz type 1 AV block during sleeping hours; Dr. Meda Coffee recommended no change in therapy at that time. Her chest discomfort correlated primarily to NSR.   Today, ***  Past Medical History:  Diagnosis Date   Irregular menses    Left ulnar fracture 06/22/2019   acute traumatic left ulna nightstick fracture, non-displaced   Menstrual headache    Mobitz type 1 second degree atrioventricular block    PSVT (paroxysmal supraventricular tachycardia) (HCC)    Pure hypercholesterolemia 09/02/2012   Lab Results  Component Value Date   CHOL 246* 09/01/2012   HDL 75 09/01/2012   LDLCALC 154* 09/01/2012   TRIG 87 09/01/2012   CHOLHDL 3.3 09/01/2012       Unspecified vitamin D deficiency      Past Surgical History:  Procedure Laterality Date   APPENDECTOMY      Current Medications: No outpatient medications have been marked as taking for the 08/18/21 encounter (Appointment) with Freada Bergeron, MD.     Allergies:   Patient has no known allergies.   Social History   Socioeconomic History   Marital status: Married    Spouse name: Not on file   Number of children: Not on file   Years of education: Not on file   Highest education level: Not on file  Occupational History   Occupation: homemaker  Tobacco Use   Smoking status: Never   Smokeless tobacco: Never  Vaping Use   Vaping Use: Never used  Substance and Sexual Activity   Alcohol use: Yes    Comment: 2/month   Drug use: No   Sexual activity: Yes    Partners: Male    Birth control/protection: Condom  Other Topics Concern   Not on file  Social History Narrative   Agricultural engineer (former Pharmacist, hospital).  Lives with husband and 2 children. Son is at [employer], daughter is a Equities trader.   1 dog      Updated 03/2021   Social Determinants of Health   Financial Resource Strain: Not on file  Food Insecurity: Not on file  Transportation Needs: Not on file  Physical Activity: Not on file  Stress: Not on file  Social Connections: Not on file     Family History:  The patient's ***family history includes Allergies in her mother; Breast cancer (age of onset: 4) in her maternal aunt; Cancer in her father, maternal grandmother, and paternal grandfather; Colon cancer (age of onset: 25) in her father; Coronary artery disease in her father and maternal aunt; Diabetes in her father; Heart disease in her maternal grandfather and paternal grandfather; Hyperlipidemia in her mother; Hypertension in her father; Osteoporosis in her maternal grandmother and mother.  ROS:   Please see the history of present illness.    *** All other systems reviewed and are negative.  EKGs/Labs/Other Studies Reviewed:    The following studies were  reviewed today: 2D Echo 04/2019  1. Left ventricular ejection fraction, by visual estimation, is 55 to  60%. The left ventricle has normal function. There is no left ventricular  hypertrophy.   2. Global right ventricle has normal systolic function.The right  ventricular size is normal. No increase in right ventricular wall  thickness.   3. Left atrial size was normal.   4. Right atrial size was normal.   5. The mitral valve is normal in structure. No evidence of mitral valve  regurgitation.   6. The tricuspid valve is normal in structure. Tricuspid valve  regurgitation is trivial.   7. The aortic valve is normal in structure. Aortic valve regurgitation is  not visualized.   8. The pulmonic valve was normal in structure. Pulmonic valve  regurgitation is not visualized.   9. The atrial septum is grossly normal.    Cor CT 03/2019 EXAM: Cardiac/Coronary  CTA   TECHNIQUE: The patient was scanned on a [employer].   FINDINGS: A 100 kV prospective scan was triggered in the descending thoracic aorta at 111 HU's. Axial non-contrast 3 mm slices were carried out through the heart. The data set was analyzed on a dedicated work station and scored using the [place]. Gantry rotation speed was 250 msecs and collimation was .6 mm. No beta blockade and 0.8 mg of sl NTG was given. The 3D data set was reconstructed in 5% intervals of the 67-82 % of the R-R cycle. Diastolic phases were analyzed on a dedicated work station using MPR, MIP and VRT modes. The patient received 80 cc of contrast.   Aorta:  Normal size.  No calcifications.  No dissection.   Aortic Valve:  Trileaflet.  No calcifications.   Coronary Arteries:  Normal coronary origin.  Right dominance.   RCA is a large dominant artery that gives rise to PDA and PLA. There is no plaque.   Left main is a large artery that gives rise to LAD and LCX arteries. Left main has no plaque.   LAD is a large vessel that  gives rise to one diagonal artery and has no plaque.   LCX is a non-dominant artery that gives rise to one large OM1 branch. There is no plaque.   Other findings:   Normal pulmonary vein drainage into the left atrium.   Normal left atrial appendage without a thrombus.   Normal size of the pulmonary artery.   IMPRESSION: 1. Coronary calcium score of 0. This was 0 percentile for age and sex matched control.   2. Normal coronary origin with right dominance.   3. CAD-RADS 0. No evidence of CAD (0%). Consider non-atherosclerotic causes of chest pain.     Electronically Signed   By: Ena Dawley   On: 04/14/2019 12:47     IMPRESSION: No significant incidental noncardiac findings are noted.  EKG:  EKG is *** ordered today.  The ekg ordered today demonstrates ***  Recent Labs: 08/23/2020: ALT 28; BUN 12; Creatinine, Ser 0.81; Potassium 4.3; Sodium 139  Recent Lipid Panel    Component Value Date/Time   CHOL 148 08/23/2020 0738   TRIG 62 08/23/2020 0738   HDL 76 08/23/2020 0738   CHOLHDL 1.9 08/23/2020 0738   CHOLHDL 2.9 02/12/2016 0955   VLDL 17 02/12/2016 0955   LDLCALC 59 08/23/2020 0738     Risk Assessment/Calculations:   {Does this patient have ATRIAL FIBRILLATION?[phone]}       Physical Exam:    VS:  LMP 06/08/2018 (Approximate)     Wt Readings from Last 3 Encounters:  04/02/21 146 lb (66.2 kg)  05/24/20 143 lb 12.8 oz (65.2 kg)  03/25/20 143 lb 6.4 oz (65 kg)     GEN: *** Well nourished, well developed in no acute distress HEENT: Normal NECK: No JVD; No carotid bruits LYMPHATICS: No lymphadenopathy CARDIAC: ***RRR, no murmurs, rubs, gallops RESPIRATORY:  Clear to auscultation without rales, wheezing or rhonchi  ABDOMEN: Soft, non-tender, non-distended MUSCULOSKELETAL:  No edema; No deformity  SKIN: Warm and dry NEUROLOGIC:  Alert and oriented x 3 PSYCHIATRIC:  Normal affect   ASSESSMENT:    No diagnosis found. PLAN:    In order of  problems listed above:  #HLD: Ca score 0.  -Continue crestor 10mg  daily  #HTN: -Continue amlodipine 2.5mg  daily  #PSVT: Controlled without significant symptoms. Not on BB.  #Non-cardiac chest pain: Coronary CTA 10/20 with no obstructive disease. Ca score 0. TTE 04/2019 with LVEF 55-60%, normal RV, no significant valve disease.      {Are you ordering a CV Procedure (e.g. stress test, cath, DCCV, TEE, etc)?   Press F2        :[id number]}    Medication Adjustments/Labs and Tests Ordered: Current medicines are reviewed at length with the patient today.  Concerns regarding medicines are outlined above.  No orders of the defined types were placed in this encounter.  No orders of the defined types were placed in this encounter.   There are no Patient Instructions on file for this visit.   Signed, Freada Bergeron, MD  08/11/2021 1:24 PM    [facility]

## 2021-08-18 ENCOUNTER — Other Ambulatory Visit: Payer: Self-pay

## 2021-08-18 ENCOUNTER — Ambulatory Visit: Payer: Commercial Managed Care - PPO | Admitting: Cardiology

## 2021-08-18 ENCOUNTER — Encounter: Payer: Self-pay | Admitting: Cardiology

## 2021-08-18 VITALS — BP 128/80 | HR 69 | Ht 67.0 in | Wt 147.0 lb

## 2021-08-18 DIAGNOSIS — R072 Precordial pain: Secondary | ICD-10-CM

## 2021-08-18 DIAGNOSIS — E785 Hyperlipidemia, unspecified: Secondary | ICD-10-CM

## 2021-08-18 DIAGNOSIS — I10 Essential (primary) hypertension: Secondary | ICD-10-CM | POA: Diagnosis not present

## 2021-08-18 DIAGNOSIS — Z79899 Other long term (current) drug therapy: Secondary | ICD-10-CM | POA: Diagnosis not present

## 2021-08-18 DIAGNOSIS — R002 Palpitations: Secondary | ICD-10-CM

## 2021-08-18 DIAGNOSIS — I471 Supraventricular tachycardia: Secondary | ICD-10-CM

## 2021-08-18 MED ORDER — AMLODIPINE BESYLATE 2.5 MG PO TABS
2.5000 mg | ORAL_TABLET | Freq: Two times a day (BID) | ORAL | 3 refills | Status: DC
Start: 2021-08-18 — End: 2022-07-22

## 2021-08-18 NOTE — Patient Instructions (Signed)
Medication Instructions:   INCREASE YOUR AMLODIPINE TO TAKING 2.5 MG BY MOUTH TWICE DAILY  *If you need a refill on your cardiac medications before your next appointment, please call your pharmacy*   Lab Work:  IN ONE WEEK HERE IN THE OFFICE--CHECK LIPIDS AT THAT TIME--PLEASE COME FASTING TO THIS LAB APPOINTMENT  If you have labs (blood work) drawn today and your tests are completely normal, you will receive your results only by: [place] (if you have MyChart) OR A paper copy in the mail If you have any lab test that is abnormal or we need to change your treatment, we will call you to review the results.   Follow-Up: At [facility], you and your health needs are our priority.  As part of our continuing mission to provide you with exceptional heart care, we have created designated Provider Care Teams.  These Care Teams include your primary Cardiologist (physician) and Advanced Practice Providers (APPs -  Physician Assistants and Nurse Practitioners) who all work together to provide you with the care you need, when you need it.  We recommend signing up for the patient portal called "MyChart".  Sign up information is provided on this After Visit Summary.  MyChart is used to connect with patients for Virtual Visits (Telemedicine).  Patients are able to view lab/test results, encounter notes, upcoming appointments, etc.  Non-urgent messages can be sent to your provider as well.   To learn more about what you can do with MyChart, go to [email or web address].    Your next appointment:   1 year(s)  The format for your next appointment:   In Person  Provider:   DR. Johney Frame

## 2021-08-18 NOTE — Progress Notes (Signed)
Cardiology Office Note:    Date:  08/18/2021   ID:  Martha Barton, DOB 1970-11-11, MRN [id number]  PCP:  Rita Ohara, MD   [facility] Providers Cardiologist:  Ena Dawley, MD {   Referring MD: Rita Ohara, MD    History of Present Illness:    Martha Barton is a 51 y.o. female with a hx of hyperlipidemia, borderline HTN, PSVT, second degree type 1 AV block, and family history of CAD who was previously followed by Dr. Meda Coffee who now presents to clinic for follow-up.  Per review of the record, the patient has very strong family history of premature coronary artery disease. Her father had first myocardial infarction in his 71s and bypass at age of 83. Both of her grandfathers also underwent bypass surgery. She has been recently diagnosed with hypertension on several occasions however have not been starting her medications. Her LDL has always been elevated despite effort in her diet and exercise. She exercises regularly. She has a history of chest pain with mixed features - this occurs with hot flashes and with extreme exertion. Fortunately coronary CTA 04/14/19 was normal. Event monitor showed sinus bradycardia to sinus tachycardia with 6 very short runs of SVT, one episode of second degree Mobitz type 1 AV block during sleeping hours; Dr. Meda Coffee recommended no change in therapy at that time. Her chest discomfort correlated primarily to NSR.   Today, she is doing well. She reports the fluttering palpitations have decreased in frequency to very rarely.   However, the chest discomfort continues to persist. The chest pain occurs primarily when she has hot flashes. The hot flashes started several years ago and they are frequent and severe. She reports being officially in menopause. She has not tried any treatments to mitigate her menopause symptoms. The chest discomfort does not bother her but does worry her when it happens.   She has tried isosorbide in the past and did  well with it but was subsequently switched to amlodipine which she is tolerating well.  She records her blood pressure at home occasionally. Her readings at home are typically 182X systolic over 93Z diastolic. She has a Paramedic.   She stays active by exercising regularly. She notices her heart rate will elevate quickly when working out.   The patient denies dyspnea at rest or with exertion, PND, orthopnea, or leg swelling. Denies cough or fever Denies nausea, vomiting. Denies syncope or presyncope. Denies snoring.  Past Medical History:  Diagnosis Date   Irregular menses    Left ulnar fracture 06/22/2019   acute traumatic left ulna nightstick fracture, non-displaced   Menstrual headache    Mobitz type 1 second degree atrioventricular block    PSVT (paroxysmal supraventricular tachycardia) (HCC)    Pure hypercholesterolemia 09/02/2012   Lab Results  Component Value Date   CHOL 246* 09/01/2012   HDL 75 09/01/2012   LDLCALC 154* 09/01/2012   TRIG 87 09/01/2012   CHOLHDL 3.3 09/01/2012       Unspecified vitamin D deficiency     Past Surgical History:  Procedure Laterality Date   APPENDECTOMY      Current Medications: Current Meds  Medication Sig   amLODipine (NORVASC) 2.5 MG tablet Take 1 tablet (2.5 mg total) by mouth 2 (two) times daily.   Calcium Carbonate Antacid (TUMS ULTRA 1000 PO) Take 1 tablet by mouth daily.   Cholecalciferol (VITAMIN D3) 125 MCG (5000 UT) TABS Take 1 tablet by mouth daily.   Multiple Vitamins-Minerals (  MULTIVITAMIN WITH MINERALS) tablet Take 2 tablets by mouth daily.    rosuvastatin (CRESTOR) 10 MG tablet Take 1 tablet (10 mg total) by mouth daily. Please keep upcoming appointment in February 2023 for future refills. Thank you   [DISCONTINUED] amLODipine (NORVASC) 2.5 MG tablet Take 1 tablet (2.5 mg total) by mouth daily. Please keep upcoming appointment in February 2023 for future refills.Thank you     Allergies:   Patient has no known allergies.    Social History   Socioeconomic History   Marital status: Married    Spouse name: Not on file   Number of children: Not on file   Years of education: Not on file   Highest education level: Not on file  Occupational History   Occupation: homemaker  Tobacco Use   Smoking status: Never   Smokeless tobacco: Never  Vaping Use   Vaping Use: Never used  Substance and Sexual Activity   Alcohol use: Yes    Comment: 2/month   Drug use: No   Sexual activity: Yes    Partners: Male    Birth control/protection: Condom  Other Topics Concern   Not on file  Social History Narrative   Agricultural engineer (former Pharmacist, hospital).  Lives with husband and 2 children. Son is at [employer], daughter is a Equities trader.   1 dog      Updated 03/2021   Social Determinants of Health   Financial Resource Strain: Not on file  Food Insecurity: Not on file  Transportation Needs: Not on file  Physical Activity: Not on file  Stress: Not on file  Social Connections: Not on file     Family History: The patient's family history includes Allergies in her mother; Breast cancer (age of onset: 1) in her maternal aunt; Cancer in her father, maternal grandmother, and paternal grandfather; Colon cancer (age of onset: 63) in her father; Coronary artery disease in her father and maternal aunt; Diabetes in her father; Heart disease in her maternal grandfather and paternal grandfather; Hyperlipidemia in her mother; Hypertension in her father; Osteoporosis in her maternal grandmother and mother.  ROS:   Please see the history of present illness.    Review of Systems  Constitutional:  Negative for malaise/fatigue and weight loss.  HENT:  Negative for congestion and sore throat.   Eyes:  Negative for pain.  Respiratory:  Negative for cough and shortness of breath.   Cardiovascular:  Positive for chest pain. Negative for palpitations, orthopnea, claudication, leg swelling and PND.  Gastrointestinal:  Negative for constipation and heartburn.   Genitourinary:  Negative for frequency and urgency.  Musculoskeletal:  Negative for joint pain and myalgias.  Skin:  Negative for itching and rash.  Neurological:  Positive for sensory change (Hot Flashes). Negative for dizziness and headaches.  Endo/Heme/Allergies:  Does not bruise/bleed easily.  Psychiatric/Behavioral:  The patient is not nervous/anxious and does not have insomnia.   All other systems reviewed and are negative.  EKGs/Labs/Other Studies Reviewed:    The following studies were reviewed today: 2D Echo 04/2019  1. Left ventricular ejection fraction, by visual estimation, is 55 to  60%. The left ventricle has normal function. There is no left ventricular hypertrophy.   2. Global right ventricle has normal systolic function.The right  ventricular size is normal. No increase in right ventricular wall  thickness.   3. Left atrial size was normal.   4. Right atrial size was normal.   5. The mitral valve is normal in structure. No evidence of mitral valve  regurgitation.   6. The tricuspid valve is normal in structure. Tricuspid valve  regurgitation is trivial.   7. The aortic valve is normal in structure. Aortic valve regurgitation is  not visualized.   8. The pulmonic valve was normal in structure. Pulmonic valve  regurgitation is not visualized.   9. The atrial septum is grossly normal.    Cor CT 03/2019 EXAM: Cardiac/Coronary  CTA   TECHNIQUE: The patient was scanned on a [employer].   FINDINGS: A 100 kV prospective scan was triggered in the descending thoracic aorta at 111 HU's. Axial non-contrast 3 mm slices were carried out through the heart. The data set was analyzed on a dedicated work station and scored using the [place]. Gantry rotation speed was 250 msecs and collimation was .6 mm. No beta blockade and 0.8 mg of sl NTG was given. The 3D data set was reconstructed in 5% intervals of the 67-82 % of the R-R cycle. Diastolic phases  were analyzed on a dedicated work station using MPR, MIP and VRT modes. The patient received 80 cc of contrast.   Aorta:  Normal size.  No calcifications.  No dissection.   Aortic Valve:  Trileaflet.  No calcifications.   Coronary Arteries:  Normal coronary origin.  Right dominance.   RCA is a large dominant artery that gives rise to PDA and PLA. There is no plaque.   Left main is a large artery that gives rise to LAD and LCX arteries. Left main has no plaque.   LAD is a large vessel that gives rise to one diagonal artery and has no plaque.   LCX is a non-dominant artery that gives rise to one large OM1 branch. There is no plaque.   Other findings:   Normal pulmonary vein drainage into the left atrium.   Normal left atrial appendage without a thrombus.   Normal size of the pulmonary artery.   IMPRESSION: 1. Coronary calcium score of 0. This was 0 percentile for age and sex matched control.   2. Normal coronary origin with right dominance.   3. CAD-RADS 0. No evidence of CAD (0%). Consider non-atherosclerotic causes of chest pain.   IMPRESSION: No significant incidental noncardiac findings are noted.    EKG:   08/18/21: Sinus rhythm, rate 69 bpm  Recent Labs: 08/23/2020: ALT 28; BUN 12; Creatinine, Ser 0.81; Potassium 4.3; Sodium 139  Recent Lipid Panel    Component Value Date/Time   CHOL 148 08/23/2020 0738   TRIG 62 08/23/2020 0738   HDL 76 08/23/2020 0738   CHOLHDL 1.9 08/23/2020 0738   CHOLHDL 2.9 02/12/2016 0955   VLDL 17 02/12/2016 0955   LDLCALC 59 08/23/2020 0738     Risk Assessment/Calculations:           Physical Exam:    VS:  BP 128/80    Pulse 69    Ht 5\' 7"  (1.702 m)    Wt 147 lb (66.7 kg)    LMP 06/08/2018 (Approximate)    SpO2 97%    BMI 23.02 kg/m     Wt Readings from Last 3 Encounters:  08/18/21 147 lb (66.7 kg)  04/02/21 146 lb (66.2 kg)  05/24/20 143 lb 12.8 oz (65.2 kg)     GEN: Well nourished, well developed in no acute  distress HEENT: Normal NECK: No JVD; No carotid bruits CARDIAC: RRR, 1/6 flow murmur, no rubs or gallops RESPIRATORY:  Clear to auscultation without rales, wheezing or rhonchi  ABDOMEN: Soft, non-tender,  non-distended MUSCULOSKELETAL:  No edema; No deformity  SKIN: Warm and dry NEUROLOGIC:  Alert and oriented x 3 PSYCHIATRIC:  Normal affect   ASSESSMENT:    1. Precordial pain   2. Hyperlipidemia, unspecified hyperlipidemia type   3. Essential hypertension   4. Medication management   5. PSVT (paroxysmal supraventricular tachycardia) (HCC)   6. Palpitations    PLAN:    In order of problems listed above:  #Chest Pain: #Possible vasospasm vs microvascular angina: Coronary CTA 10/20 with no obstructive disease. Ca score 0. TTE 04/2019 with LVEF 55-60%, normal RV, no significant valve disease. Continues to have intermittent chest squeezing mainly with hot flashes. Currently on amlodipine.  -Will increase amlodipine to 2.5mg  BID -Can add back imdur pending symptoms -Coronary CTA without obstructive disease, Ca score 0 -TTE with normal BiV function and no significant valve disease  #HLD: Ca score 0.  -Continue crestor 10mg  daily -Repeat lipids next week  #HTN: Well controlled. Will increase amlodipine to 2.5mg  BID for possible vasospasm.  #PSVT: Controlled without significant symptoms. Not on BB.      Medication Adjustments/Labs and Tests Ordered: Current medicines are reviewed at length with the patient today.  Concerns regarding medicines are outlined above.  Orders Placed This Encounter  Procedures   Lipid Profile   EKG 12-Lead   Meds ordered this encounter  Medications   amLODipine (NORVASC) 2.5 MG tablet    Sig: Take 1 tablet (2.5 mg total) by mouth 2 (two) times daily.    Dispense:  180 tablet    Refill:  3    DOSE INCREASE    Patient Instructions  Medication Instructions:   INCREASE YOUR AMLODIPINE TO TAKING 2.5 MG BY MOUTH TWICE DAILY  *If you need a  refill on your cardiac medications before your next appointment, please call your pharmacy*   Lab Work:  IN ONE WEEK HERE IN THE OFFICE--CHECK LIPIDS AT THAT TIME--PLEASE COME FASTING TO THIS LAB APPOINTMENT  If you have labs (blood work) drawn today and your tests are completely normal, you will receive your results only by: [place] (if you have MyChart) OR A paper copy in the mail If you have any lab test that is abnormal or we need to change your treatment, we will call you to review the results.   Follow-Up: At [facility], you and your health needs are our priority.  As part of our continuing mission to provide you with exceptional heart care, we have created designated Provider Care Teams.  These Care Teams include your primary Cardiologist (physician) and Advanced Practice Providers (APPs -  Physician Assistants and Nurse Practitioners) who all work together to provide you with the care you need, when you need it.  We recommend signing up for the patient portal called "MyChart".  Sign up information is provided on this After Visit Summary.  MyChart is used to connect with patients for Virtual Visits (Telemedicine).  Patients are able to view lab/test results, encounter notes, upcoming appointments, etc.  Non-urgent messages can be sent to your provider as well.   To learn more about what you can do with MyChart, go to [email or web address].    Your next appointment:   1 year(s)  The format for your next appointment:   In Person  Provider:   DR. Reece Leader as a scribe for Freada Bergeron, MD.,have documented all relevant documentation on the behalf of Freada Bergeron, MD,as directed by  Freada Bergeron, MD  while in the presence of Freada Bergeron, MD.  I, Freada Bergeron, MD, have reviewed all documentation for this visit. The documentation on 08/18/21 for the exam, diagnosis, procedures, and orders are all accurate and  complete.   Signed, Freada Bergeron, MD  08/18/2021 9:46 AM    [place]

## 2021-08-28 ENCOUNTER — Other Ambulatory Visit: Payer: Self-pay

## 2021-08-28 ENCOUNTER — Other Ambulatory Visit: Payer: Commercial Managed Care - PPO

## 2021-08-28 DIAGNOSIS — I10 Essential (primary) hypertension: Secondary | ICD-10-CM

## 2021-08-28 DIAGNOSIS — Z79899 Other long term (current) drug therapy: Secondary | ICD-10-CM

## 2021-08-28 DIAGNOSIS — E785 Hyperlipidemia, unspecified: Secondary | ICD-10-CM

## 2021-08-28 LAB — LIPID PANEL
Chol/HDL Ratio: 2.4 ratio (ref 0.0–4.4)
Cholesterol, Total: 171 mg/dL (ref 100–199)
HDL: 72 mg/dL (ref >39)
LDL Chol Calc (NIH): 83 mg/dL (ref 0–99)
Triglycerides: 88 mg/dL (ref 0–149)
VLDL Cholesterol Cal: 16 mg/dL (ref 5–40)

## 2021-08-29 ENCOUNTER — Telehealth: Payer: Self-pay | Admitting: Cardiology

## 2021-08-29 NOTE — Telephone Encounter (Signed)
° °  Pt is returning call to get lab result °

## 2021-08-29 NOTE — Telephone Encounter (Signed)
Freada Bergeron, MD  ?08/29/2021 10:30 AM EST   ?  ?Her LDL cholesterol rose a little bit to 83 from 59. She does not need to adjust her medication at this time as her calcium score is 0. She should continue her crestor and healthy lifestyle changes and we can repeat her cholesterol at her next visit.   ? ? ?Pt is aware of results (reviewed in MyChart).  She will continue her current medications and healthy lifestyle changes.  Pt had no further questions at the time of the call.  ?

## 2021-09-04 ENCOUNTER — Other Ambulatory Visit: Payer: Self-pay | Admitting: Physician Assistant

## 2021-12-02 ENCOUNTER — Other Ambulatory Visit: Payer: Self-pay | Admitting: Family Medicine

## 2021-12-02 DIAGNOSIS — Z1231 Encounter for screening mammogram for malignant neoplasm of breast: Secondary | ICD-10-CM

## 2021-12-18 DIAGNOSIS — Z1231 Encounter for screening mammogram for malignant neoplasm of breast: Secondary | ICD-10-CM

## 2022-01-05 ENCOUNTER — Other Ambulatory Visit: Payer: Self-pay | Admitting: Family Medicine

## 2022-01-05 DIAGNOSIS — Z1231 Encounter for screening mammogram for malignant neoplasm of breast: Secondary | ICD-10-CM

## 2022-01-21 DIAGNOSIS — Z1231 Encounter for screening mammogram for malignant neoplasm of breast: Secondary | ICD-10-CM

## 2022-01-22 ENCOUNTER — Other Ambulatory Visit: Payer: Self-pay | Admitting: Family Medicine

## 2022-01-22 DIAGNOSIS — Z1231 Encounter for screening mammogram for malignant neoplasm of breast: Secondary | ICD-10-CM

## 2022-01-28 ENCOUNTER — Ambulatory Visit
Admission: RE | Admit: 2022-01-28 | Discharge: 2022-01-28 | Disposition: A | Discharge status: Home / Self Care | Payer: Commercial Managed Care - PPO | Source: Ambulatory Visit

## 2022-01-28 DIAGNOSIS — Z1231 Encounter for screening mammogram for malignant neoplasm of breast: Secondary | ICD-10-CM

## 2022-02-25 ENCOUNTER — Encounter: Payer: Self-pay | Admitting: Internal Medicine

## 2022-03-31 ENCOUNTER — Encounter: Payer: Self-pay | Admitting: Internal Medicine

## 2022-04-06 NOTE — Progress Notes (Unsigned)
No chief complaint on file.   Martha Barton is a 51 y.o. female who presents for a complete physical.   She has the following concerns:   She is under the care of cardiologist, referred when having left sided chest pain, palpitations and some elevated blood pressures. She had Zio patch, CT calcium score and echo.  The monitor showed sinus bradycardia to sinus tachycardia. 6 very short runs of SVT, one episode of second degree Mobitz type AV block during sleeping hours. Normal coronary CTA with no evidence for CAD (calcium score of 0). She was prescribed amlodipine 2.76m in 04/2019.  She never really took this (patient had concerns about bone healing from fracture), no side effects, and was changed to losartan in 08/2019.  Red yeast rice was also started by Dr. NMeda Coffee In 05/2020 cardiology started her on Crestor 171m(failed trial of RYR, concerns regarding her strong FH--LDL was up to 168).   Recheck of lipids were at goal. Lab Results  Component Value Date   CHOL 171 08/28/2021   HDL 72 08/28/2021   LDLCALC 83 08/28/2021   TRIG 88 08/28/2021   CHOLHDL 2.4 08/28/2021   She was also started on amlodipine 2.31m66mn 05/2020 for some chest pain and borderline BP's. She was noted previously to be bradycardic at night, recommended avoiding AV nodal acting agents.  She saw Dr. PemJohney Frame 07/2021 and her amlodipine was increased to 2.31mg56mD, concern for possible vasospasm as cause for her ongoing chest pain. She had been having chest pain with her hot flashes, and with exertion. She rarely gets fluttering or palpitations.  Denies side effects from amlodipine and rosuvastatin.  BP's at home are running   Postmenopausal, no cycle since the end of 2019. +hot flashes and night sweats, fluctuates, a little iimproved in frequency and severity.  She does note some vaginal dryness and dyspareunia. Has tried a lubricant, only helped a little.   Vitamin D deficiency:  Level was low at 26  in 2014.  Last level was normal at 45 in 03/2020, when taking MVI gummy daily, as well as 1000 IU of Vitamin D daily. She currently still takes both supplements.   Immunization History  Administered Date(s) Administered   Influenza Split 05/08/2009, 05/09/2010   Influenza,inj,Quad PF,6+ Mos 02/12/2016, 02/20/2019, 03/25/2020   Influenza-Unspecified 03/24/2021   PFIZER(Purple Top)SARS-COV-2 Vaccination 09/10/2019, 10/01/2019, 07/08/2020   Pfizer Covid-19 Vaccine Bivalent Booster [id number] 03/24/2021   Td 06/22/2005   Tdap 02/12/2016   Zoster Recombinat (Shingrix) 04/10/2021   Last Pap smear: 03/2021 normal, no high risk HPV detected Last mammogram: 01/2022 Last colonoscopy: 04/2020, Dr. Hung.Benson [place]verticulosis Last DEXA: never Dentist: twice yearly Ophtho:  every 1-2 years, due Exercise:   [facility] [employer] [facility]--gets weight-bearing exercise in her classes (4x/week). Also does some walking.   PMH, PSH, SH and FH were reviewed and updated    ROS:  The patient denies anorexia, fever, vision changes, decreased hearing, ear pain, sore throat, breast concerns, syncope, dyspnea on exertion, cough, swelling, nausea, vomiting, diarrhea, constipation, abdominal pain, melena, hematochezia, indigestion/heartburn, hematuria, incontinence, dysuria, genital lesions, joint pains, numbness, tingling, weakness, tremor, depression, anxiety, abnormal bleeding/bruising, or enlarged lymph nodes. Denies changes in skin/bowels/moods/energy Noted some hair loss the last few years, gradual thinning. No patches of alopecia. No worsening since menopause. Denies vaginal bleeding, discharge, odor, itch. +vaginal dryness and pain with intercourse. Hot flashes, night sweats, comes in waves, overall just slightly better than last year.  Left  sided chest pain per HPI. No further palpitations (very rare).   PHYSICAL EXAM:  LMP 05/23/2018    Wt Readings from Last 3 Encounters:  08/18/21 147 lb (66.7 kg)   04/02/21 146 lb (66.2 kg)  05/24/20 143 lb 12.8 oz (65.2 kg)    General Appearance:    Alert, cooperative, no distress, appears stated age  Head:    Normocephalic, without obvious abnormality, atraumatic  Eyes:    PERRL, conjunctiva/corneas clear, EOM's intact, fundi benign  Ears:    Normal TM's and external ear canals  Nose:   No drainage or sinus tenderness  Throat:   Normal mucosa, no lesions  Neck:   Supple, no lymphadenopathy;  thyroid:  no enlargement/ tenderness/nodules; no carotid bruit or JVD  Back:    Spine nontender, no curvature, ROM normal, no CVA tenderness  Lungs:     Clear to auscultation bilaterally without wheezes, rales or ronchi; respirations unlabored  Chest Wall:    Nontender, no deformity   Heart:    Regular rate and rhythm, S1 and S2 normal, no murmur, rub   or gallop  Breast Exam:    No tenderness, masses, or nipple discharge or inversion.  No axillary lymphadenopathy.   Abdomen:     Soft, non-tender, nondistended, normoactive bowel sounds,    no masses, no hepatosplenomegaly  Genitalia:    Normal external genitalia without lesions.  BUS and vagina normal. No abnormal discharge or cervical motion tenderness.  Uterus and adnexa not enlarged, nontender, no masses.  Pap not performed  Rectal:    Normal sphincter tone, no mass.  Heme negative stool.  Extremities:   No clubbing, cyanosis or edema.   Pulses:   2+ and symmetric all extremities  Skin:   Skin color, texture, turgor normal, no rashes.   Lymph nodes:   Cervical, supraclavicular, and axillary nodes normal  Neurologic:   Normal strength, sensation and gait; reflexes 2+ and symmetric throughout                Psych:   Normal mood, affect, hygiene and grooming   ASSESSMENT/PLAN:  Did she get 2nd Shingrix? (Doesn't look like she had another NV for it here) Flu and COVID rec 2nd Shingrix--NV  Cbc, c-met D if change in supplements TSH if sx  Discussed monthly self breast exams and yearly mammograms; at  least 30 minutes of aerobic activity at least 5 days/week, weight-bearing exercise at least 2x/week; proper sunscreen use reviewed; healthy diet, including goals of calcium and vitamin D intake and alcohol recommendations (less than or equal to 1 drink/day) reviewed; regular seatbelt use; changing batteries in smoke detectors.  Immunization recommendations-- Flu COVID Shingrix #2 Colonoscopy UTD.   F/u 1 year, sooner prn.

## 2022-04-06 NOTE — Patient Instructions (Incomplete)
  HEALTH MAINTENANCE RECOMMENDATIONS:  It is recommended that you get at least 30 minutes of aerobic exercise at least 5 days/week (for weight loss, you may need as much as 60-90 minutes). This can be any activity that gets your heart rate up. This can be divided in 10-15 minute intervals if needed, but try and build up your endurance at least once a week.  Weight bearing exercise is also recommended twice weekly.  Eat a healthy diet with lots of vegetables, fruits and fiber.  "Colorful" foods have a lot of vitamins (ie green vegetables, tomatoes, red peppers, etc).  Limit sweet tea, regular sodas and alcoholic beverages, all of which has a lot of calories and sugar.  Up to 1 alcoholic drink daily may be beneficial for women (unless trying to lose weight, watch sugars).  Drink a lot of water.  Calcium recommendations are 1200-1500 mg daily (1500 mg for postmenopausal women or women without ovaries), and vitamin D 1000 IU daily.  This should be obtained from diet and/or supplements (vitamins), and calcium should not be taken all at once, but in divided doses.  Monthly self breast exams and yearly mammograms for women over the age of 57 is recommended.  Sunscreen of at least SPF 30 should be used on all sun-exposed parts of the skin when outside between the hours of 10 am and 4 pm (not just when at beach or pool, but even with exercise, golf, tennis, and yard work!)  Use a sunscreen that says "broad spectrum" so it covers both UVA and UVB rays, and make sure to reapply every 1-2 hours.  Remember to change the batteries in your smoke detectors when changing your clock times in the spring and fall. Carbon monoxide detectors are recommended for your home.  Use your seat belt every time you are in a car, and please drive safely and not be distracted with cell phones and texting while driving.  Consider trying coconut oil as a lubricant. Vaginal estrogen creams are another option.  It is used twice a week.  Let us know if you'd like to try this.  You can consider trying Prilosec OTC or Pepcid AC before dinner (or before bedtime) to see if this helps eliminate the early morning upper stomach discomfort.,

## 2022-04-08 ENCOUNTER — Encounter: Payer: Self-pay | Admitting: Family Medicine

## 2022-04-08 ENCOUNTER — Ambulatory Visit (INDEPENDENT_AMBULATORY_CARE_PROVIDER_SITE_OTHER): Payer: Commercial Managed Care - PPO | Admitting: Family Medicine

## 2022-04-08 VITALS — BP 120/68 | HR 60 | Ht 67.0 in | Wt 148.6 lb

## 2022-04-08 DIAGNOSIS — E7800 Pure hypercholesterolemia, unspecified: Secondary | ICD-10-CM | POA: Diagnosis not present

## 2022-04-08 DIAGNOSIS — Z0000 Encounter for general adult medical examination without abnormal findings: Secondary | ICD-10-CM

## 2022-04-08 DIAGNOSIS — R1013 Epigastric pain: Secondary | ICD-10-CM

## 2022-04-08 DIAGNOSIS — Z5181 Encounter for therapeutic drug level monitoring: Secondary | ICD-10-CM | POA: Diagnosis not present

## 2022-04-08 DIAGNOSIS — I10 Essential (primary) hypertension: Secondary | ICD-10-CM

## 2022-04-08 DIAGNOSIS — Z23 Encounter for immunization: Secondary | ICD-10-CM | POA: Diagnosis not present

## 2022-04-08 LAB — POCT URINALYSIS DIP (PROADVANTAGE DEVICE)
Bilirubin, UA: NEGATIVE
Blood, UA: NEGATIVE
Glucose, UA: NEGATIVE mg/dL
Ketones, POC UA: NEGATIVE mg/dL
Leukocytes, UA: NEGATIVE
Nitrite, UA: NEGATIVE
Protein Ur, POC: NEGATIVE mg/dL
Specific Gravity, Urine: 1.005
Urobilinogen, Ur: NEGATIVE
pH, UA: 6 (ref 5.0–8.0)

## 2022-04-09 LAB — CBC WITH DIFFERENTIAL/PLATELET
Basophils Absolute: 0 10*3/uL (ref 0.0–0.2)
Basos: 1 %
EOS (ABSOLUTE): 0.1 10*3/uL (ref 0.0–0.4)
Eos: 2 %
Hematocrit: 38.6 % (ref 34.0–46.6)
Hemoglobin: 12.9 g/dL (ref 11.1–15.9)
Immature Grans (Abs): 0 10*3/uL (ref 0.0–0.1)
Immature Granulocytes: 0 %
Lymphocytes Absolute: 1.5 10*3/uL (ref 0.7–3.1)
Lymphs: 35 %
MCH: 29.3 pg (ref 26.6–33.0)
MCHC: 33.4 g/dL (ref 31.5–35.7)
MCV: 88 fL (ref 79–97)
Monocytes Absolute: 0.4 10*3/uL (ref 0.1–0.9)
Monocytes: 10 %
Neutrophils Absolute: 2.3 10*3/uL (ref 1.4–7.0)
Neutrophils: 52 %
Platelets: 267 10*3/uL (ref 150–450)
RBC: 4.4 x10E6/uL (ref 3.77–5.28)
RDW: 12.3 % (ref 11.7–15.4)
WBC: 4.4 10*3/uL (ref 3.4–10.8)

## 2022-04-09 LAB — COMPREHENSIVE METABOLIC PANEL
ALT: 13 IU/L (ref 0–32)
AST: 24 IU/L (ref 0–40)
Albumin/Globulin Ratio: 2.3 — ABNORMAL HIGH (ref 1.2–2.2)
Albumin: 4.9 g/dL (ref 3.8–4.9)
Alkaline Phosphatase: 77 IU/L (ref 44–121)
BUN/Creatinine Ratio: 19 (ref 9–23)
BUN: 14 mg/dL (ref 6–24)
Bilirubin Total: 0.3 mg/dL (ref 0.0–1.2)
CO2: 25 mmol/L (ref 20–29)
Calcium: 9.3 mg/dL (ref 8.7–10.2)
Chloride: 103 mmol/L (ref 96–106)
Creatinine, Ser: 0.73 mg/dL (ref 0.57–1.00)
Globulin, Total: 2.1 g/dL (ref 1.5–4.5)
Glucose: 84 mg/dL (ref 70–99)
Potassium: 4.3 mmol/L (ref 3.5–5.2)
Sodium: 140 mmol/L (ref 134–144)
Total Protein: 7 g/dL (ref 6.0–8.5)
eGFR: 100 mL/min/{1.73_m2} (ref >59)

## 2022-07-22 ENCOUNTER — Telehealth: Payer: Self-pay | Admitting: Cardiology

## 2022-07-22 DIAGNOSIS — I10 Essential (primary) hypertension: Secondary | ICD-10-CM

## 2022-07-22 DIAGNOSIS — Z79899 Other long term (current) drug therapy: Secondary | ICD-10-CM

## 2022-07-22 DIAGNOSIS — E785 Hyperlipidemia, unspecified: Secondary | ICD-10-CM

## 2022-07-22 MED ORDER — AMLODIPINE BESYLATE 2.5 MG PO TABS: 2.5000 mg | ORAL_TABLET | Freq: Two times a day (BID) | ORAL | 1 refills | Status: DC

## 2022-07-22 MED ORDER — ROSUVASTATIN CALCIUM 10 MG PO TABS: 10.0000 mg | ORAL_TABLET | Freq: Every day | ORAL | 1 refills | Status: DC

## 2022-07-22 NOTE — Telephone Encounter (Signed)
Pt's medications were sent to pt's pharmacy as requested. Confirmation received.  

## 2022-07-22 NOTE — Telephone Encounter (Signed)
*  STAT* If patient is at the pharmacy, call can be transferred to refill team.   1. Which medications need to be refilled? (please list name of each medication and dose if known) amLODipine (NORVASC) 2.5 MG tablet ; rosuvastatin (CRESTOR) 10 MG tablet   2. Which pharmacy/location (including street and city if local pharmacy) is medication to be sent to? [id number] [place] - [address]  3. Do they need a 30 day or 90 day supply? [address]

## 2022-08-13 ENCOUNTER — Ambulatory Visit: Payer: Commercial Managed Care - PPO | Admitting: Cardiology

## 2022-09-21 LAB — LIPID PANEL
Cholesterol: 134 (ref 0–200)
HDL: 61 (ref 35–70)
LDL Cholesterol: 60
Triglycerides: 63 (ref 40–160)

## 2022-10-18 NOTE — Progress Notes (Unsigned)
Cardiology Office Note:    Date:  10/21/2022   ID:  Martha Barton, DOB Nov 14, 1970, MRN [id number]  PCP:  Joselyn Arrow, MD   [facility] Providers Cardiologist:  Meriam Sprague, MD {   Referring MD: Joselyn Arrow, MD    History of Present Illness:    Martha Barton is a 52 y.o. female with a hx of hyperlipidemia, borderline HTN, PSVT, second degree type 1 AV block, and family history of CAD who was previously followed by Dr. Delton See who now presents to clinic for follow-up.  Per review of the record, the patient has very strong family history of premature coronary artery disease. Her father had first myocardial infarction in his 13s and bypass at age of 88. Both of her grandfathers also underwent bypass surgery. She has been recently diagnosed with hypertension on several occasions however have not been starting her medications. Her LDL has always been elevated despite effort in her diet and exercise. She exercises regularly. She has a history of chest pain with mixed features - this occurs with hot flashes and with extreme exertion. Fortunately coronary CTA 04/14/19 was normal. Event monitor showed sinus bradycardia to sinus tachycardia with 6 very short runs of SVT, one episode of second degree Mobitz type 1 AV block during sleeping hours; Dr. Delton See recommended no change in therapy at that time. Her chest discomfort correlated primarily to NSR.   Was last seen in clinic 07/2021 where she was doing well from a CV standpoint. Her amlodipine was increased for vasospasm.  Today, the patient states she is doing okay. She continues to have intermittent chest pressure with hot flashes and in the beginning of exercise that improves as she continues to exert herself. Symptoms have not improved significantly with amlodipine. Also has been having episodes of substernal pressure/epigastric pain that wakes her up at night for the past 6 months. Symptoms improve with sitting up.  Otherwise, no orthopnea, LE edema, PND, lightheadedness or dizziness.  LDL 60, HDL 61, TG 63 on recent labs,   Past Medical History:  Diagnosis Date   Irregular menses    Left ulnar fracture 06/22/2019   acute traumatic left ulna nightstick fracture, non-displaced   Menstrual headache    Mobitz type 1 second degree atrioventricular block    PSVT (paroxysmal supraventricular tachycardia)    Pure hypercholesterolemia 09/02/2012   Lab Results  Component Value Date   CHOL 246* 09/01/2012   HDL 75 09/01/2012   LDLCALC 154* 09/01/2012   TRIG 87 09/01/2012   CHOLHDL 3.3 09/01/2012       Unspecified vitamin D deficiency     Past Surgical History:  Procedure Laterality Date   APPENDECTOMY      Current Medications: Current Meds  Medication Sig   Calcium Carbonate Antacid (TUMS ULTRA 1000 PO) Take 1 tablet by mouth daily.   Cholecalciferol (VITAMIN D3) 125 MCG (5000 UT) TABS Take 1 tablet by mouth daily.   Multiple Vitamins-Minerals (MULTIVITAMIN WITH MINERALS) tablet Take 2 tablets by mouth daily.    [DISCONTINUED] amLODipine (NORVASC) 2.5 MG tablet Take 1 tablet (2.5 mg total) by mouth 2 (two) times daily.   [DISCONTINUED] rosuvastatin (CRESTOR) 10 MG tablet Take 1 tablet (10 mg total) by mouth daily.     Allergies:   Patient has no known allergies.   Social History   Socioeconomic History   Marital status: Married    Spouse name: Not on file   Number of children: Not on file   Years  of education: Not on file   Highest education level: Not on file  Occupational History   Occupation: homemaker  Tobacco Use   Smoking status: Never   Smokeless tobacco: Never  Vaping Use   Vaping Use: Never used  Substance and Sexual Activity   Alcohol use: Yes    Comment: 2/month   Drug use: No   Sexual activity: Yes    Partners: Male    Birth control/protection: Post-menopausal  Other Topics Concern   Not on file  Social History Narrative   Futures trader (former Runner, broadcasting/film/video).  Married, 2 children.  Son is at [employer], daughter is at [employer].   1 dog      Updated 03/2022   Social Determinants of Health   Financial Resource Strain: Not on file  Food Insecurity: Not on file  Transportation Needs: Not on file  Physical Activity: Not on file  Stress: Not on file  Social Connections: Not on file     Family History: The patient's family history includes Allergies in her mother; Breast cancer (age of onset: 62) in her maternal aunt; Cancer in her father, maternal grandmother, and paternal grandfather; Colon cancer (age of onset: 72) in her father; Coronary artery disease in her father and maternal aunt; Diabetes in her father; Heart disease in her maternal grandfather and paternal grandfather; Hyperlipidemia in her mother; Hypertension in her father; Osteoporosis in her maternal grandmother and mother.  ROS:   Please see the history of present illness.    Review of Systems  Constitutional:  Negative for malaise/fatigue and weight loss.  HENT:  Negative for congestion and sore throat.   Eyes:  Negative for pain.  Respiratory:  Negative for cough and shortness of breath.   Cardiovascular:  Positive for chest pain. Negative for palpitations, orthopnea, claudication, leg swelling and PND.  Gastrointestinal:  Negative for constipation and heartburn.  Genitourinary:  Negative for frequency and urgency.  Musculoskeletal:  Negative for joint pain and myalgias.  Skin:  Negative for itching and rash.  Neurological:  Negative for dizziness, loss of consciousness and headaches.  Psychiatric/Behavioral:  The patient is nervous/anxious.    All other systems reviewed and are negative.  EKGs/Labs/Other Studies Reviewed:    The following studies were reviewed today: 2D Echo 04/2019  1. Left ventricular ejection fraction, by visual estimation, is 55 to  60%. The left ventricle has normal function. There is no left ventricular hypertrophy.   2. Global right ventricle has normal systolic  function.The right  ventricular size is normal. No increase in right ventricular wall  thickness.   3. Left atrial size was normal.   4. Right atrial size was normal.   5. The mitral valve is normal in structure. No evidence of mitral valve  regurgitation.   6. The tricuspid valve is normal in structure. Tricuspid valve  regurgitation is trivial.   7. The aortic valve is normal in structure. Aortic valve regurgitation is  not visualized.   8. The pulmonic valve was normal in structure. Pulmonic valve  regurgitation is not visualized.   9. The atrial septum is grossly normal.    Cor CT 03/2019 EXAM: Cardiac/Coronary  CTA   TECHNIQUE: The patient was scanned on a [employer].   FINDINGS: A 100 kV prospective scan was triggered in the descending thoracic aorta at 111 HU's. Axial non-contrast 3 mm slices were carried out through the heart. The data set was analyzed on a dedicated work station and scored using the Agatson method.  Gantry rotation speed was 250 msecs and collimation was .6 mm. No beta blockade and 0.8 mg of sl NTG was given. The 3D data set was reconstructed in 5% intervals of the 67-82 % of the R-R cycle. Diastolic phases were analyzed on a dedicated work station using MPR, MIP and VRT modes. The patient received 80 cc of contrast.   Aorta:  Normal size.  No calcifications.  No dissection.   Aortic Valve:  Trileaflet.  No calcifications.   Coronary Arteries:  Normal coronary origin.  Right dominance.   RCA is a large dominant artery that gives rise to PDA and PLA. There is no plaque.   Left main is a large artery that gives rise to LAD and LCX arteries. Left main has no plaque.   LAD is a large vessel that gives rise to one diagonal artery and has no plaque.   LCX is a non-dominant artery that gives rise to one large OM1 branch. There is no plaque.   Other findings:   Normal pulmonary vein drainage into the left atrium.   Normal left atrial  appendage without a thrombus.   Normal size of the pulmonary artery.   IMPRESSION: 1. Coronary calcium score of 0. This was 0 percentile for age and sex matched control.   2. Normal coronary origin with right dominance.   3. CAD-RADS 0. No evidence of CAD (0%). Consider non-atherosclerotic causes of chest pain.   IMPRESSION: No significant incidental noncardiac findings are noted.    EKG:   NSR, HR 79-personally reviewed  Recent Labs: 04/08/2022: ALT 13; BUN 14; Creatinine, Ser 0.73; Hemoglobin 12.9; Platelets 267; Potassium 4.3; Sodium 140  Recent Lipid Panel    Component Value Date/Time   CHOL 171 08/28/2021 0813   TRIG 88 08/28/2021 0813   HDL 72 08/28/2021 0813   CHOLHDL 2.4 08/28/2021 0813   CHOLHDL 2.9 02/12/2016 0955   VLDL 17 02/12/2016 0955   LDLCALC 83 08/28/2021 0813     Risk Assessment/Calculations:           Physical Exam:    VS:  BP 124/76   Pulse 79   Ht 5\' 7"  (1.702 m)   Wt 144 lb 5.1 oz (65.5 kg)   LMP 05/23/2018   SpO2 97%   BMI 22.60 kg/m     Wt Readings from Last 3 Encounters:  10/21/22 144 lb 5.1 oz (65.5 kg)  04/08/22 148 lb 9.6 oz (67.4 kg)  08/18/21 147 lb (66.7 kg)     GEN: Well nourished, well developed in no acute distress HEENT: Normal NECK: No JVD; No carotid bruits CARDIAC: RRR, 1/6 systolic murmur RUSB RESPIRATORY:  Clear to auscultation without rales, wheezing or rhonchi  ABDOMEN: Soft, non-tender, non-distended MUSCULOSKELETAL:  No edema; No deformity  SKIN: Warm and dry NEUROLOGIC:  Alert and oriented x 3 PSYCHIATRIC:  Normal affect   ASSESSMENT:    1. Precordial pain   2. Medication management   3. Essential hypertension   4. Hyperlipidemia, unspecified hyperlipidemia type   5. Palpitations   6. PSVT (paroxysmal supraventricular tachycardia)    PLAN:    In order of problems listed above:  #Chest Pain: #Possible vasospasm vs microvascular angina: Coronary CTA 10/20 with no obstructive disease. Ca  score 0. TTE 04/2019 with LVEF 55-60%, normal RV, no significant valve disease. Continues to have intermittent chest squeezing mainly with hot flashes and beginning of exercise. Currently on amlodipine with stable symptoms. Declined starting imdur today. -Continue amlodipine 2.5mg  BID -Coronary  CTA without obstructive disease, Ca score 0 -TTE with normal BiV function and no significant valve disease  #Suspected Reflux: Patient with different episode of chest discomfort when laying down at night that improves with sitting up. Pain different than typical chest pain as detailed above. Symptoms sound most consistent with GERD. Recommended trial of PPI to see if symptoms improve. If not, would discuss with PCP further  #HLD: Ca score 0.  -Continue crestor 10mg  daily -LDL very well controlled at 60  #HTN: Well controlled.  -Continue amlodipine 2.5mg  BID  #PSVT: Controlled without significant symptoms. Not on BB.      Medication Adjustments/Labs and Tests Ordered: Current medicines are reviewed at length with the patient today.  Concerns regarding medicines are outlined above.  Orders Placed This Encounter  Procedures   EKG 12-Lead   Meds ordered this encounter  Medications   amLODipine (NORVASC) 2.5 MG tablet    Sig: Take 1 tablet (2.5 mg total) by mouth 2 (two) times daily.    Dispense:  180 tablet    Refill:  1    Pt must keep upcoming appt with Dr. Shari Prows in May 2024 before anymore refills. Thank you Final Attempt   rosuvastatin (CRESTOR) 10 MG tablet    Sig: Take 1 tablet (10 mg total) by mouth daily.    Dispense:  90 tablet    Refill:  1    Pt must keep upcoming appt with Dr. Shari Prows in May 2024 before anymore refills. Thank you Final Attempt    Patient Instructions  Medication Instructions:  TRAIL FOR NEXIUM FOR POSSIBLE REFLUX, TAKE ONE TABLET DAILY BEFORE BEDTIME  Your physician recommends that you continue on your current medications as directed.  Please refer to  the Current Medication list given to you today.  *If you need a refill on your cardiac medications before your next appointment, please call your pharmacy*   Lab Work: NONE If you have labs (blood work) drawn today and your tests are completely normal, you will receive your results only by: MyChart Message (if you have MyChart) OR A paper copy in the mail If you have any lab test that is abnormal or we need to change your treatment, we will call you to review the results.     Follow-Up: At [facility], you and your health needs are our priority.  As part of our continuing mission to provide you with exceptional heart care, we have created designated Provider Care Teams.  These Care Teams include your primary Cardiologist (physician) and Advanced Practice Providers (APPs -  Physician Assistants and Nurse Practitioners) who all work together to provide you with the care you need, when you need it.   Your next appointment:   1 year(s)  Provider:   Meriam Sprague, MD        Signed, Meriam Sprague, MD  10/21/2022 9:10 AM    [facility]

## 2022-10-21 ENCOUNTER — Encounter: Payer: Self-pay | Admitting: Cardiology

## 2022-10-21 ENCOUNTER — Ambulatory Visit: Payer: Commercial Managed Care - PPO | Attending: Cardiology | Admitting: Cardiology

## 2022-10-21 VITALS — BP 124/76 | HR 79 | Ht 67.0 in | Wt 144.3 lb

## 2022-10-21 DIAGNOSIS — R072 Precordial pain: Secondary | ICD-10-CM

## 2022-10-21 DIAGNOSIS — Z79899 Other long term (current) drug therapy: Secondary | ICD-10-CM | POA: Diagnosis not present

## 2022-10-21 DIAGNOSIS — I471 Supraventricular tachycardia, unspecified: Secondary | ICD-10-CM

## 2022-10-21 DIAGNOSIS — R002 Palpitations: Secondary | ICD-10-CM

## 2022-10-21 DIAGNOSIS — I10 Essential (primary) hypertension: Secondary | ICD-10-CM | POA: Diagnosis not present

## 2022-10-21 DIAGNOSIS — E785 Hyperlipidemia, unspecified: Secondary | ICD-10-CM | POA: Diagnosis not present

## 2022-10-21 MED ORDER — ROSUVASTATIN CALCIUM 10 MG PO TABS: 10.0000 mg | ORAL_TABLET | Freq: Every day | ORAL | 1 refills | Status: DC

## 2022-10-21 MED ORDER — AMLODIPINE BESYLATE 2.5 MG PO TABS: 2.5000 mg | ORAL_TABLET | Freq: Two times a day (BID) | ORAL | 1 refills | Status: DC

## 2022-10-21 NOTE — Patient Instructions (Signed)
Medication Instructions:  TRAIL FOR NEXIUM FOR POSSIBLE REFLUX, TAKE ONE TABLET DAILY BEFORE BEDTIME  Your physician recommends that you continue on your current medications as directed.  Please refer to the Current Medication list given to you today.  *If you need a refill on your cardiac medications before your next appointment, please call your pharmacy*   Lab Work: NONE If you have labs (blood work) drawn today and your tests are completely normal, you will receive your results only by: MyChart Message (if you have MyChart) OR A paper copy in the mail If you have any lab test that is abnormal or we need to change your treatment, we will call you to review the results.     Follow-Up: At [facility], you and your health needs are our priority.  As part of our continuing mission to provide you with exceptional heart care, we have created designated Provider Care Teams.  These Care Teams include your primary Cardiologist (physician) and Advanced Practice Providers (APPs -  Physician Assistants and Nurse Practitioners) who all work together to provide you with the care you need, when you need it.   Your next appointment:   1 year(s)  Provider:   Meriam Sprague, MD

## 2023-03-03 ENCOUNTER — Other Ambulatory Visit: Payer: Self-pay | Admitting: Family Medicine

## 2023-03-03 DIAGNOSIS — Z1231 Encounter for screening mammogram for malignant neoplasm of breast: Secondary | ICD-10-CM

## 2023-03-23 ENCOUNTER — Ambulatory Visit
Admission: RE | Admit: 2023-03-23 | Discharge: 2023-03-23 | Disposition: A | Discharge status: Home / Self Care | Payer: Commercial Managed Care - PPO | Source: Ambulatory Visit

## 2023-03-23 DIAGNOSIS — Z1231 Encounter for screening mammogram for malignant neoplasm of breast: Secondary | ICD-10-CM

## 2023-04-10 NOTE — Patient Instructions (Incomplete)

## 2023-04-10 NOTE — Progress Notes (Unsigned)
No chief complaint on file.  Martha Barton is a 52 y.o. female who presents for a complete physical.     She is under the care of cardiologist, referred when having left sided chest pain, palpitations and some elevated blood pressures. She had Zio patch, CT calcium score and echo. The monitor showed sinus bradycardia to sinus tachycardia. 6 very short runs of SVT, one episode of second degree Mobitz type AV block during sleeping hours. Normal coronary CTA with no evidence for CAD (calcium score of 0). She was prescribed amlodipine 2.5mg  in 04/2019, never really took this (patient had concerns about bone healing from fracture), was changed to losartan in 08/2019.  She was started on Crestor in 05/2020 (failed trial of RYR, LDL 168, and also d/t strong FHx CAD). She was also started on amlodipine at that time for chest pain and borderline BP's. Recheck of lipids were at goal. Due for recheck. Last lipids done with insurance labs--01/2022: TC 161, HDL 80, LDL 68. Lab Results  Component Value Date   CHOL 171 08/28/2021   HDL 72 08/28/2021   LDLCALC 83 08/28/2021   TRIG 88 08/28/2021   CHOLHDL 2.4 08/28/2021   She was noted previously to be bradycardic at night, recommended avoiding AV nodal acting agents.  She last saw Dr. Shari Prows in 10/2022. No changes to amlodipine 2.5mg  BID (for possible vasospasm vs microvascular angina). She hasn't noticed any significant change. She is taking it 5mg  once a day ***UPDATE She had also reported some chest discomfort at night, improves with sitting up, felt to be GERD. She was given a trial of Nexium to take at night. She still has chest pain with her hot flashes, and with exertion (which improves/resolves as she continues to exercise).   She rarely gets fluttering or palpitations. Denies side effects from amlodipine and rosuvastatin. BP's at home are running   She denies headaches, dizziness.  BP Readings from Last 3 Encounters:  10/21/22  124/76  04/08/22 120/68  08/18/21 128/80     Postmenopausal, no cycle since the end of 2019. +hot flashes and night sweats, fluctuates, a little iimproved in frequency and severity.  She does note some vaginal dryness and dyspareunia. Has tried a lubricant, only helped a little.   Vitamin D deficiency:  Level was low at 26 in 2014.  Last level was normal at 45 in 03/2020, when taking MVI gummy daily, as well as 1000 IU of Vitamin D daily. She currently still takes both supplements.   Immunization History  Administered Date(s) Administered   Influenza Split 05/08/2009, 05/09/2010   Influenza,inj,Quad PF,6+ Mos 02/12/2016, 02/20/2019, 03/25/2020, 04/08/2022   Influenza-Unspecified 03/24/2021   PFIZER Comirnaty(Gray Top)Covid-19 Tri-Sucrose Vaccine 04/08/2022   PFIZER(Purple Top)SARS-COV-2 Vaccination 09/10/2019, 10/01/2019, 07/08/2020   Pfizer Covid-19 Vaccine Bivalent Booster 45yrs & up 03/24/2021   Td 06/22/2005   Tdap 02/12/2016   Zoster Recombinant(Shingrix) 04/10/2021, 07/11/2021   Last Pap smear: 03/2021 normal, no high risk HPV detected Last mammogram: 03/2023 Last colonoscopy: 04/2020, Dr. Elnoria Howard.  Diverticulosis Last DEXA: never Dentist: twice yearly Ophtho:  every 1-2 years Exercise:   [facility]--gets weight-bearing exercise in her classes (4x/week). Also does some walking.   PMH, PSH, SH and FH were reviewed and updated    ROS:  The patient denies anorexia, fever, vision changes, decreased hearing, ear pain, sore throat, breast concerns, syncope, dyspnea on exertion, cough, swelling, nausea, vomiting, diarrhea, constipation, abdominal pain, melena, hematochezia, indigestion/heartburn, incontinence, dysuria, genital lesions, joint pains,  numbness, tingling, weakness, tremor, depression, anxiety, abnormal bleeding/bruising, or enlarged lymph nodes. Denies changes in skin/bowels/moods/energy  Noted some continued hair loss the last few years, gradual thinning.  No patches of alopecia. No worsening since menopause. Denies vaginal bleeding, discharge, odor, itch. +vaginal dryness and pain with intercourse. Hot flashes, night sweats, tolerable.  Some associated CP per HPI. Denies palpitations. CP at night? Better with Nexium?    PHYSICAL EXAM:  LMP 05/23/2018    Wt Readings from Last 3 Encounters:  10/21/22 144 lb 5.1 oz (65.5 kg)  04/08/22 148 lb 9.6 oz (67.4 kg)  08/18/21 147 lb (66.7 kg)    General Appearance:    Alert, cooperative, no distress, appears stated age  Head:    Normocephalic, without obvious abnormality, atraumatic  Eyes:    PERRL, conjunctiva/corneas clear, EOM's intact, fundi benign  Ears:    Normal TM's and external ear canals  Nose:   No drainage or sinus tenderness  Throat:   Normal mucosa, no lesions  Neck:   Supple, no lymphadenopathy;  thyroid:  no enlargement/ tenderness/nodules; no carotid bruit or JVD  Back:    Spine nontender, no curvature, ROM normal, no CVA tenderness  Lungs:     Clear to auscultation bilaterally without wheezes, rales or ronchi; respirations unlabored  Chest Wall:    Nontender, no deformity   Heart:    Regular rate and rhythm, S1 and S2 normal, no murmur, rub   or gallop  Breast Exam:    No tenderness, masses, or nipple discharge or inversion.  No axillary lymphadenopathy.   Abdomen:     Soft, non-tender, nondistended, normoactive bowel sounds,    no masses, no hepatosplenomegaly. Area of pressure is epigastric.  Genitalia:    Normal external genitalia without lesions.  BUS and vagina normal. No abnormal discharge or cervical motion tenderness.  Uterus and adnexa not enlarged, nontender, no masses.  Pap not performed  Rectal:    Normal sphincter tone, no mass.  Heme negative stool.  Extremities:   No clubbing, cyanosis or edema.  Pulses:   2+ and symmetric all extremities  Skin:   Skin color, texture, turgor normal, no rashes.   Lymph nodes:   Cervical, supraclavicular, inguinal and axillary  nodes normal  Neurologic:   Normal strength, sensation and gait; reflexes 2+ and symmetric throughout                Psych:   Normal mood, affect, hygiene and grooming    ASSESSMENT/PLAN:  Add lipids (crestor rx'd by cardiology, but no lipids >1 yr, unless she had done again by insurance, last 01/2022)   Discussed monthly self breast exams and yearly mammograms; at least 30 minutes of aerobic activity at least 5 days/week, weight-bearing exercise at least 2x/week; proper sunscreen use reviewed; healthy diet, including goals of calcium and vitamin D intake and alcohol recommendations (less than or equal to 1 drink/day) reviewed; regular seatbelt use; changing batteries in smoke detectors.  Immunization recommendations--Flu and COVID booster given today. Colonoscopy UTD.   F/u 1 year, sooner prn.

## 2023-04-12 ENCOUNTER — Encounter: Payer: Self-pay | Admitting: Family Medicine

## 2023-04-12 ENCOUNTER — Ambulatory Visit (INDEPENDENT_AMBULATORY_CARE_PROVIDER_SITE_OTHER): Payer: Commercial Managed Care - PPO | Admitting: Family Medicine

## 2023-04-12 VITALS — BP 120/80 | HR 76 | Ht 67.0 in | Wt 144.2 lb

## 2023-04-12 DIAGNOSIS — Z23 Encounter for immunization: Secondary | ICD-10-CM

## 2023-04-12 DIAGNOSIS — Z5181 Encounter for therapeutic drug level monitoring: Secondary | ICD-10-CM

## 2023-04-12 DIAGNOSIS — K219 Gastro-esophageal reflux disease without esophagitis: Secondary | ICD-10-CM

## 2023-04-12 DIAGNOSIS — E7800 Pure hypercholesterolemia, unspecified: Secondary | ICD-10-CM

## 2023-04-12 DIAGNOSIS — E559 Vitamin D deficiency, unspecified: Secondary | ICD-10-CM

## 2023-04-12 DIAGNOSIS — R002 Palpitations: Secondary | ICD-10-CM

## 2023-04-12 DIAGNOSIS — Z0000 Encounter for general adult medical examination without abnormal findings: Secondary | ICD-10-CM

## 2023-04-12 DIAGNOSIS — I10 Essential (primary) hypertension: Secondary | ICD-10-CM | POA: Diagnosis not present

## 2023-04-12 LAB — POCT URINALYSIS DIP (PROADVANTAGE DEVICE)
Bilirubin, UA: NEGATIVE
Blood, UA: NEGATIVE
Glucose, UA: NEGATIVE mg/dL
Ketones, POC UA: NEGATIVE mg/dL
Nitrite, UA: NEGATIVE
Protein Ur, POC: NEGATIVE mg/dL
Specific Gravity, Urine: 1.005
Urobilinogen, Ur: 0.2
pH, UA: 6 (ref 5.0–8.0)

## 2023-04-12 MED ORDER — OMEPRAZOLE 40 MG PO CPDR
40.0000 mg | DELAYED_RELEASE_CAPSULE | Freq: Every day | ORAL | 2 refills | Status: DC
Start: 2023-04-12 — End: 2024-01-27

## 2023-04-13 ENCOUNTER — Encounter: Payer: Self-pay | Admitting: *Deleted

## 2023-04-13 LAB — CBC WITH DIFFERENTIAL/PLATELET
Basophils Absolute: 0 10*3/uL (ref 0.0–0.2)
Basos: 1 %
EOS (ABSOLUTE): 0.1 10*3/uL (ref 0.0–0.4)
Eos: 2 %
Hematocrit: 38.3 % (ref 34.0–46.6)
Hemoglobin: 12.7 g/dL (ref 11.1–15.9)
Immature Grans (Abs): 0 10*3/uL (ref 0.0–0.1)
Immature Granulocytes: 0 %
Lymphocytes Absolute: 2 10*3/uL (ref 0.7–3.1)
Lymphs: 34 %
MCH: 30 pg (ref 26.6–33.0)
MCHC: 33.2 g/dL (ref 31.5–35.7)
MCV: 90 fL (ref 79–97)
Monocytes Absolute: 0.4 10*3/uL (ref 0.1–0.9)
Monocytes: 7 %
Neutrophils Absolute: 3.3 10*3/uL (ref 1.4–7.0)
Neutrophils: 56 %
Platelets: 307 10*3/uL (ref 150–450)
RBC: 4.24 x10E6/uL (ref 3.77–5.28)
RDW: 12.3 % (ref 11.7–15.4)
WBC: 5.8 10*3/uL (ref 3.4–10.8)

## 2023-04-13 LAB — CMP14+EGFR
ALT: 18 IU/L (ref 0–32)
AST: 28 IU/L (ref 0–40)
Albumin: 4.9 g/dL (ref 3.8–4.9)
Alkaline Phosphatase: 77 [IU]/L (ref 44–121)
BUN/Creatinine Ratio: 15 (ref 9–23)
BUN: 12 mg/dL (ref 6–24)
Bilirubin Total: 0.4 mg/dL (ref 0.0–1.2)
CO2: 26 mmol/L (ref 20–29)
Calcium: 9.5 mg/dL (ref 8.7–10.2)
Chloride: 100 mmol/L (ref 96–106)
Creatinine, Ser: 0.82 mg/dL (ref 0.57–1.00)
Globulin, Total: 2.2 g/dL (ref 1.5–4.5)
Glucose: 81 mg/dL (ref 70–99)
Potassium: 4.1 mmol/L (ref 3.5–5.2)
Sodium: 140 mmol/L (ref 134–144)
Total Protein: 7.1 g/dL (ref 6.0–8.5)
eGFR: 86 mL/min/{1.73_m2} (ref >59)

## 2023-04-13 LAB — TSH: TSH: 1.3 u[IU]/mL (ref 0.450–4.500)

## 2023-04-13 NOTE — Progress Notes (Signed)
Done

## 2023-04-30 ENCOUNTER — Telehealth: Payer: Self-pay | Admitting: Cardiology

## 2023-04-30 DIAGNOSIS — Z79899 Other long term (current) drug therapy: Secondary | ICD-10-CM

## 2023-04-30 DIAGNOSIS — E785 Hyperlipidemia, unspecified: Secondary | ICD-10-CM

## 2023-04-30 DIAGNOSIS — I10 Essential (primary) hypertension: Secondary | ICD-10-CM

## 2023-04-30 MED ORDER — ROSUVASTATIN CALCIUM 10 MG PO TABS: 10.0000 mg | ORAL_TABLET | Freq: Every day | ORAL | 1 refills | Status: DC

## 2023-04-30 MED ORDER — AMLODIPINE BESYLATE 2.5 MG PO TABS: 2.5000 mg | ORAL_TABLET | Freq: Two times a day (BID) | ORAL | 1 refills | Status: DC

## 2023-04-30 NOTE — Telephone Encounter (Signed)
Pt's medications were sent to pt's pharmacy as requested. Confirmation received.  

## 2023-04-30 NOTE — Telephone Encounter (Signed)
*  STAT* If patient is at the pharmacy, call can be transferred to refill team.   1. Which medications need to be refilled? (please list name of each medication and dose if known) rosuvastatin (CRESTOR) 10 MG tablet  amLODipine (NORVASC) 2.5 MG tablet   2. Which pharmacy/location (including street and city if local pharmacy) is medication to be sent to?  [id number] - [place], [place] - [address]    3. Do they need a 30 day or 90 day supply? 90

## 2023-05-27 HISTORY — PX: BASAL CELL CARCINOMA EXCISION: SHX1214

## 2023-06-08 ENCOUNTER — Encounter: Payer: Self-pay | Admitting: *Deleted

## 2023-09-21 ENCOUNTER — Telehealth: Payer: Self-pay | Admitting: Cardiology

## 2023-09-21 DIAGNOSIS — I10 Essential (primary) hypertension: Secondary | ICD-10-CM

## 2023-09-21 DIAGNOSIS — Z79899 Other long term (current) drug therapy: Secondary | ICD-10-CM

## 2023-09-21 DIAGNOSIS — E785 Hyperlipidemia, unspecified: Secondary | ICD-10-CM

## 2023-09-21 MED ORDER — ROSUVASTATIN CALCIUM 10 MG PO TABS: 10.0000 mg | ORAL_TABLET | Freq: Every day | ORAL | 1 refills | Status: DC

## 2023-09-21 MED ORDER — AMLODIPINE BESYLATE 2.5 MG PO TABS: 2.5000 mg | ORAL_TABLET | Freq: Two times a day (BID) | ORAL | 1 refills | Status: DC

## 2023-09-21 NOTE — Telephone Encounter (Signed)
*  STAT* If patient is at the pharmacy, call can be transferred to refill team.   1. Which medications need to be refilled? (please list name of each medication and dose if known)   amLODipine (NORVASC) 2.5 MG tablet  rosuvastatin (CRESTOR) 10 MG tablet    4. Which pharmacy/location (including street and city if local pharmacy) is medication to be sent to? [id number] - [place], [place] - [address]     5. Do they need a 30 day or 90 day supply? 90  Pt scheduled to see Dr. Cristal Deer 01/27/24

## 2024-01-26 NOTE — Progress Notes (Signed)
 Cardiology Office Note:  .   Date:  01/27/2024  ID:  Martha Barton, DOB December 23, 1970, MRN [id number] PCP: Randol Dawes, MD  [facility] Providers Cardiologist:  Shelda Bruckner, MD {  History of Present Illness: .   Martha Barton is a 53 y.o. female with PMH hyperlipidemia, pSVT, 2nd degree type I AV block, family history of CAD. She was previously followed by Dr. Hobart and established care with me on 01/27/24.  Pertinent CV history: Strong FH of premature CAD, father with first MI in his 34s and CABG age 9. Both grandfathers with CAD/CABG.   Has history of chest discomfort, echo and coronary CT in 2020 was unremarkable. Has been treated with amlodipine  for possible vasospasm.   Today: Hasn't noticed chest discomfort in recent months. Has noticed other issues (foot cramps, etc) but nothing in her chest. Walks regularly, does cardio classes. Concern that her heart rate can go to 170s when she really pushes herself. Can be 130s still 5 minutes after resting. Has been active for years, has not had near syncope or high risk symptoms when pushing herself even when HR elevated.  Palpitations were very bad several years ago, now much improved. Feels rare skipped beat, usually when lying down. Does have sensation of her heart beating sometimes when sitting still, not fast/irregular, just noticeable.  ROS: Denies chest pain, shortness of breath at rest or with normal exertion. No PND, orthopnea, LE edema or unexpected weight gain. No syncope. ROS otherwise negative except as noted.   Studies Reviewed: SABRA    EKG:  EKG Interpretation Date/Time:  Thursday January 27 2024 14:01:01 EDT Ventricular Rate:  76 PR Interval:  138 QRS Duration:  72 QT Interval:  378 QTC Calculation: 425 R Axis:   85  Text Interpretation: Normal sinus rhythm Normal ECG Confirmed by Bruckner Shelda [phone]) on 01/27/2024 2:43:52 PM    Physical Exam:   VS:  BP 128/86   Pulse 76   Ht  5' 7.5 (1.715 m)   Wt 144 lb 14.4 oz (65.7 kg)   LMP 05/23/2018   SpO2 98%   BMI 22.36 kg/m    Wt Readings from Last 3 Encounters:  01/27/24 144 lb 14.4 oz (65.7 kg)  04/12/23 144 lb 3.2 oz (65.4 kg)  10/21/22 144 lb 5.1 oz (65.5 kg)    GEN: Well nourished, well developed in no acute distress HEENT: Normal, moist mucous membranes NECK: No JVD CARDIAC: regular rhythm, normal S1 and S2, no rubs or gallops. No murmur. VASCULAR: Radial and DP pulses 2+ bilaterally. No carotid bruits RESPIRATORY:  Clear to auscultation without rales, wheezing or rhonchi  ABDOMEN: Soft, non-tender, non-distended MUSCULOSKELETAL:  Ambulates independently SKIN: Warm and dry, no edema NEUROLOGIC:  Alert and oriented x 3. No focal neuro deficits noted. PSYCHIATRIC:  Normal affect    ASSESSMENT AND PLAN: .    Chest pain -unclear etiology, no CAD on coronary CT in 2020, normal echo in 2020. Has been treated for possible vasospasm/microvascular angina with amlodipine . Does well on total of 5 mg amlodipine  (was taking both 2.5 mg tabs in the morning, will change to 5 mg tabs once a day) -we personally reviewed her CT images together, No evidence of plaque/CAD on scan -discussed spasm/microvascular angina, management rationale, options for testing if symptoms worsen -reviewed red flag warning signs that need immediate medical attention  Hyperlipidemia Family history of premature CAD -on statin for primary prevention, even with ca score 0 and no CAD on CT.  Highest LDL was 186 in 2019 -discussed lp(a) testing today, pros/cons. Given that there is no targeted therapy for lp(a), she would prefer to hold on testing for now until treatment is available. -lipids 09/2022 at goal (LDL 60), has labs scheduled in 2 weeks with PCP  Palpitations -much improved, notices skipped beats only rarely but does have sensation of her heart beating more commonly  CV risk counseling and prevention -recommend heart  healthy/Mediterranean diet, with whole grains, fruits, vegetable, fish, lean meats, nuts, and olive oil. Limit salt. -recommend moderate walking, 3-5 times/week for 30-50 minutes each session. Aim for at least 150 minutes.week. Goal should be pace of 3 miles/hours, or walking 1.5 miles in 30 minutes -recommend avoidance of tobacco products. Avoid excess alcohol. -ASCVD risk score: The 10-year ASCVD risk score (Arnett DK, et al., 2019) is: 1.2%*   Values used to calculate the score:     Age: 45 years     Clincally relevant sex: Female     Is Non-Hispanic African American: No     Diabetic: No     Tobacco smoker: No     Systolic Blood Pressure: 128 mmHg     Is BP treated: Yes     HDL Cholesterol: 61 mg/dL*     Total Cholesterol: 134 mg/dL*     * - Cholesterol units were assumed for this score calculation    Dispo: 1 year or sooner as needed  Total time of encounter: I spent 41 minutes dedicated to the care of this patient on the date of this encounter to include pre-visit review of records, face-to-face time with the patient discussing conditions above, and clinical documentation with the electronic health record. We specifically spent time today discussing prior test results, CV risks, current symptoms, recommendations for management   Signed, Shelda Bruckner, MD   Shelda Bruckner, MD, PhD, [facility] [place]  [facility]  [place]  Heart & Vascular at [facility] [address], [address] [place], [place] [address] [phone]

## 2024-01-27 ENCOUNTER — Encounter (HOSPITAL_BASED_OUTPATIENT_CLINIC_OR_DEPARTMENT_OTHER): Payer: Self-pay | Admitting: Cardiology

## 2024-01-27 ENCOUNTER — Ambulatory Visit (INDEPENDENT_AMBULATORY_CARE_PROVIDER_SITE_OTHER): Admitting: Cardiology

## 2024-01-27 VITALS — BP 128/86 | HR 76 | Ht 67.5 in | Wt 144.9 lb

## 2024-01-27 DIAGNOSIS — R002 Palpitations: Secondary | ICD-10-CM

## 2024-01-27 DIAGNOSIS — E7800 Pure hypercholesterolemia, unspecified: Secondary | ICD-10-CM

## 2024-01-27 DIAGNOSIS — I2089 Other forms of angina pectoris: Secondary | ICD-10-CM | POA: Insufficient documentation

## 2024-01-27 DIAGNOSIS — Z79899 Other long term (current) drug therapy: Secondary | ICD-10-CM

## 2024-01-27 DIAGNOSIS — Z7189 Other specified counseling: Secondary | ICD-10-CM

## 2024-01-27 MED ORDER — AMLODIPINE BESYLATE 5 MG PO TABS
5.0000 mg | ORAL_TABLET | Freq: Every day | ORAL | 3 refills | Status: AC
Start: 2024-01-27 — End: ?

## 2024-01-27 MED ORDER — ROSUVASTATIN CALCIUM 10 MG PO TABS: 10.0000 mg | ORAL_TABLET | Freq: Every day | ORAL | 3 refills | Status: AC

## 2024-01-27 NOTE — Patient Instructions (Signed)
 Medication Instructions:  Refill for Amlodipine  and Rosuvastatin  (Crestor ) sent to pharmacy.  *If you need a refill on your cardiac medications before your next appointment, please call your pharmacy*  Lab Work: None ordered today. If you have labs (blood work) drawn today and your tests are completely normal, you will receive your results only by: MyChart Message (if you have MyChart) OR A paper copy in the mail If you have any lab test that is abnormal or we need to change your treatment, we will call you to review the results.  Testing/Procedures: None ordered today.  Follow-Up: At [facility], you and your health needs are our priority.  As part of our continuing mission to provide you with exceptional heart care, our providers are all part of one team.  This team includes your primary Cardiologist (physician) and Advanced Practice Providers or APPs (Physician Assistants and Nurse Practitioners) who all work together to provide you with the care you need, when you need it.  Your next appointment:   1 year(s)  Provider:   Shelda Bruckner, MD

## 2024-03-06 ENCOUNTER — Encounter: Payer: Self-pay | Admitting: Family Medicine

## 2024-03-06 ENCOUNTER — Ambulatory Visit (INDEPENDENT_AMBULATORY_CARE_PROVIDER_SITE_OTHER): Admitting: Family Medicine

## 2024-03-06 ENCOUNTER — Ambulatory Visit: Payer: Self-pay

## 2024-03-06 VITALS — BP 118/72 | HR 76 | Temp 99.3°F | Ht 67.5 in | Wt 143.4 lb

## 2024-03-06 DIAGNOSIS — J45909 Unspecified asthma, uncomplicated: Secondary | ICD-10-CM | POA: Diagnosis not present

## 2024-03-06 DIAGNOSIS — R051 Acute cough: Secondary | ICD-10-CM | POA: Diagnosis not present

## 2024-03-06 DIAGNOSIS — R52 Pain, unspecified: Secondary | ICD-10-CM

## 2024-03-06 LAB — POC COVID19 BINAXNOW: SARS Coronavirus 2 Ag: NEGATIVE

## 2024-03-06 MED ORDER — BENZONATATE 200 MG PO CAPS
200.0000 mg | ORAL_CAPSULE | Freq: Three times a day (TID) | ORAL | 0 refills | Status: DC | PRN
Start: 2024-03-06 — End: 2024-04-05

## 2024-03-06 MED ORDER — AIRSUPRA 90-80 MCG/ACT IN AERO: 2.0000 | INHALATION_SPRAY | RESPIRATORY_TRACT | 0 refills | Status: AC | PRN

## 2024-03-06 MED ORDER — AZITHROMYCIN 250 MG PO TABS
ORAL_TABLET | ORAL | 0 refills | Status: AC
Start: 2024-03-06 — End: 2024-03-11

## 2024-03-06 NOTE — Patient Instructions (Signed)
  Stay well hydrated. Continue the guaifenesin (mucinex) regularly.  You may have an atypical pneumonia  (there were ome crackles on the left side of your lungs). We are going to treat you with azithromycin  (antibiotic). We are also giving you benzonatate  (a cough medication), and an inhaler as there may be a reactive airways component (like wheezing, causing the coughing spells with deep breaths).  Be sure to rinse your mouth out after using the inhaler.  We put an order in for an x-ray to [facility] (you can go to [facility] or [facility] on [address]). You can go and get this done if your cough isn't improving (or gets worse), if your fever gets higher, or other worsening symptoms.

## 2024-03-06 NOTE — Telephone Encounter (Signed)
 FYI Only or Action Required?: Action required by provider: request for appointment.  Patient was last seen in primary care on 04/12/2023 by Randol Dawes, MD.  Called Nurse Triage reporting Cough.  Symptoms began a week ago.  Interventions attempted: OTC medications: mucinex.  Symptoms are: unchanged.  Triage Disposition: See Physician Within 24 Hours  Patient/caregiver understands and will follow disposition?: YesReason for Disposition  SEVERE coughing spells (e.g., whooping sound after coughing, vomiting after coughing)  Answer Assessment - Initial Assessment Questions Pt has taken mucinex for symptoms. Pt denies SOB   1. ONSET: When did the cough begin?      1 week ago 2. SEVERITY: How bad is the cough today?      na 3. SPUTUM: Describe the color of your sputum (e.g., none, dry cough; clear, white, yellow, green)     denies 4. HEMOPTYSIS: Are you coughing up any blood? If Yes, ask: How much? (e.g., flecks, streaks, tablespoons, etc.)     denies 5. DIFFICULTY BREATHING: Are you having difficulty breathing? If Yes, ask: How bad is it? (e.g., mild, moderate, severe)      denies 6. FEVER: Do you have a fever? If Yes, ask: What is your temperature, how was it measured, and when did it start?     Not sure 7. CARDIAC HISTORY: Do you have any history of heart disease? (e.g., heart attack, congestive heart failure)      na 8. LUNG HISTORY: Do you have any history of lung disease?  (e.g., pulmonary embolus, asthma, emphysema)     denies 9. PE RISK FACTORS: Do you have a history of blood clots? (or: recent major surgery, recent prolonged travel, bedridden)     denies 10. OTHER SYMPTOMS: Do you have any other symptoms? (e.g., runny nose, wheezing, chest pain)       Crackling sounds while breathing, headache, fatigue  Protocols used: Cough - Acute Non-Productive-A-AH

## 2024-03-06 NOTE — Telephone Encounter (Signed)
 This RN was on hold for an extended period of time while awaiting pt transfer, call disconnected, LVM requesting pt return call to clinic.           Copied from CRM [id number]. Topic: Clinical - Red Word Triage >> Mar 06, 2024  8:02 AM Treva T wrote: [facility] that prompted transfer to Nurse Triage: Patient calling state she is having a persistent and worsening cough, with some chest crackling, and headache.

## 2024-03-06 NOTE — Progress Notes (Signed)
 Chief Complaint  Patient presents with   Cough    Cough x 1 week. Little congested. Body aches. Did a home covid test that was 2 years expired  last Thursday.    9/5 started feeing lightheaded, low energy, had frontal headache. Started to feel better 4-5 days later, but then the cough started. She didn't have much congestion.  Didn't have runny nose or other cold symptoms. She has felt achy since the start of all of this, hasn't changed.  Achey in arms, legs.   Cough has been dry, nonproductive. She has a very mild sore throat, thinks is secondary to the cough. Feels a little rattle when she breaths in, occasionally feels a tickle. Denies any shortness of breath. She feels more fatigued with walking, but not having any shortness of breath.  No known fever, chills. No known sick contacts. No travel.  OTC meds taken: She started with Nyquil, then a different cough medicine. Most recently she has been taking Mucinex DM (not 12 hour), using it 2-3x/week.  Doesn't seem to help.   PMH, PSH, SH reviewed  Outpatient Encounter Medications as of 03/06/2024  Medication Sig Note   amLODipine  (NORVASC ) 5 MG tablet Take 1 tablet (5 mg total) by mouth daily.    Calcium  Carbonate Antacid (TUMS ULTRA 1000 PO) Take 1 tablet by mouth daily.    Cholecalciferol (VITAMIN D ) 50 MCG (2000 UT) CAPS Take 1 capsule by mouth daily.    guaiFENesin (MUCINEX PO) Take 1 tablet by mouth in the morning, at noon, in the evening, and at bedtime. 03/06/2024: Last dose last night   Multiple Vitamins-Minerals (MULTIVITAMIN WITH MINERALS) tablet Take 2 tablets by mouth daily.  04/08/2022: Two gummies   naproxen sodium (ALEVE) 220 MG tablet Take 220 mg by mouth as needed. 03/06/2024: Took one this am   rosuvastatin  (CRESTOR ) 10 MG tablet Take 1 tablet (10 mg total) by mouth daily.    No facility-administered encounter medications on file as of 03/06/2024.   No Known Allergies  ROS: no known f/c. No n/v.  Only 1 bout of  diarrhea. Post-tussive emesis just once. Decreased appetite. No rashes No CP, palpitations.   PHYSICAL EXAM:  BP 118/72   Pulse 76   Temp 99.3 F (37.4 C) (Tympanic)   Ht 5' 7.5 (1.715 m)   Wt 143 lb 6.4 oz (65 kg)   LMP 05/23/2018   BMI 22.13 kg/m   Pleasant, well-appearing female. Spasms of dry cough when taking deep breaths, laughing.  When she had lozenge in her mouth, didn't cough at all. HEENT: conjunctiva and sclera are clear, EOMI. TM's and EAC's normal. Nasal mucosa with minimal edema, no erythema.  Slight clear drainage noted in left nares. Sinuses are nontender.  OP is clear, no cobblestoning or drainage noted. Neck: no lymphadenopathy or mass Heart: regular rate and rhythm Lungs: good air movement.  Deep breaths triggered coughing.  Crackles/ronchi on L side only Extremities: no edema Neuro: alert and oriented, cranial nerves grossly intact, normal gait Psych: normal mood, affect, hygiene and grooming  COVID-19 Ag negative   ASSESSMENT/PLAN:  Acute cough - nonproductive, worse with deep breath, RAD component. Crackles L lung field, suspect atypical PNA. Treat with zpak, Airsupra , mucinex. XR if not improving - Plan: POC COVID-19, DG Chest 2 View, azithromycin  (ZITHROMAX ) 250 MG tablet, benzonatate  (TESSALON ) 200 MG capsule  Body aches - Plan: POC COVID-19  Mild reactive airways disease, unspecified whether persistent - coughing is triggered by deep breaths. Trial ONEOK  Supra.  Reviewed proper technique, rinse after use. Savings card given - Plan: Albuterol-Budesonide (AIRSUPRA ) 90-80 MCG/ACT AERO  To go to [place] [facility] and get CXR if cough not improving with z-pak, or if persistent fever or worsening shortness of breath. Discussed DDx in detail, poss need to add another ABX (vs prednisone course) Will see how she does with initial therapy. She hadn't been aware of low grade fever. Encouraged to monitor fever at home (when aleve not in system, and after being on  ABX to see if improving.  I spent 35 minutes dedicated to the care of this patient, including pre-visit review of records, face to face time, post-visit ordering of testing and documentation.  Stay well hydrated. Continue the guaifenesin (mucinex) regularly.  You may have an atypical pneumonia  (there were ome crackles on the left side of your lungs). We are going to treat you with azithromycin  (antibiotic). We are also giving you benzonatate  (a cough medication), and an inhaler as there may be a reactive airways component (like wheezing, causing the coughing spells with deep breaths).  Be sure to rinse your mouth out after using the inhaler.  We put an order in for an x-ray to [facility] (you can go to [facility] or the [facility] on [address]). You can go and get this done if your cough isn't improving (or gets worse), if your fever gets higher, or other worsening symptoms.

## 2024-03-11 ENCOUNTER — Encounter: Payer: Self-pay | Admitting: Family Medicine

## 2024-03-13 ENCOUNTER — Ambulatory Visit
Admission: RE | Admit: 2024-03-13 | Discharge: 2024-03-13 | Disposition: A | Discharge status: Home / Self Care | Source: Ambulatory Visit | Attending: Family Medicine | Admitting: Family Medicine

## 2024-03-13 ENCOUNTER — Ambulatory Visit: Payer: Self-pay | Admitting: Family Medicine

## 2024-03-13 DIAGNOSIS — R051 Acute cough: Secondary | ICD-10-CM

## 2024-03-30 ENCOUNTER — Other Ambulatory Visit: Payer: Self-pay | Admitting: Family Medicine

## 2024-03-30 DIAGNOSIS — Z1231 Encounter for screening mammogram for malignant neoplasm of breast: Secondary | ICD-10-CM

## 2024-04-03 ENCOUNTER — Ambulatory Visit
Admission: RE | Admit: 2024-04-03 | Discharge: 2024-04-03 | Disposition: A | Discharge status: Home / Self Care | Source: Ambulatory Visit

## 2024-04-03 DIAGNOSIS — Z1231 Encounter for screening mammogram for malignant neoplasm of breast: Secondary | ICD-10-CM

## 2024-04-05 ENCOUNTER — Ambulatory Visit: Admitting: Family Medicine

## 2024-04-05 ENCOUNTER — Encounter: Payer: Self-pay | Admitting: Family Medicine

## 2024-04-05 VITALS — BP 100/60 | HR 72 | Ht 67.0 in | Wt 144.4 lb

## 2024-04-05 DIAGNOSIS — R0789 Other chest pain: Secondary | ICD-10-CM | POA: Diagnosis not present

## 2024-04-05 MED ORDER — MELOXICAM 15 MG PO TABS
ORAL_TABLET | ORAL | 0 refills | Status: AC
Start: 2024-04-05 — End: ?

## 2024-04-05 NOTE — Patient Instructions (Signed)
 Stop taking the ibuprofen. Instead take meloxicam once daily with food.  Take this every day until your pain has completely resolved (okay to take the full #15 if needed, but you don't have to finish the bottle if you are better sooner). You may use tylenol along with the meloxicam if needed. Do not take ibuprofen/motrin/advil, or naproxen/aleve or BC or Goody powder. You can continue to try some heat, Biofreeze. I suspect it may be related to an intercostal muscle that might have pulled or is inflamed where it inserts on to the ribs.  Rest and anti-inflammatories should help.  If your pain doesn't resolve, we can consider imaging ro further evaluate the ribs.

## 2024-04-05 NOTE — Telephone Encounter (Signed)
 Called patient, no answer

## 2024-04-05 NOTE — Progress Notes (Signed)
 Chief Complaint  Patient presents with   Chest discomfort    Patient has been having chest discomfort on right side x 3 weeks. The pain will come and go but is consistently there at nighttime and during certain exercise movements. Discomfort can be felt even when I am just sitting. Coughing or sneezing can be extremely painful.    Last seen 9/15 with cough, treated with zpak, airsupra  and mucinex. She had CXR done 9/22, when she was having left sided chest wall pain (where the crackles were heard at her visit). CXR showed some hyperinflation, otherwise normal.  She reports that the pain is now on the RIGHT side. She has had R sided lower rib pain x 3 weeks. She took ibuprofen this morning, which helps--doesn't always completely alleviate the pain. Using the ibuprofen sporadically--taking 400 mg most mornings, never taking a second dose. Pain gets worse related to certain movements at the gym. She has been taking ibuprofen 2 pills in the morning.  Still has discomfort at the gym, needs to modify some moves.  Has no pain the rest of the day. She has pain at night. Initially laying on the right side actually alleviated the discomfort.  Now she is more comfortable laying on the left side, hurts when on her right side.  Lidocaine patches aren't helpful. Heat helps a little Tylenol not helpful.   PMH, PSH, SH reviewed  Outpatient Encounter Medications as of 04/05/2024  Medication Sig Note   amLODipine  (NORVASC ) 5 MG tablet Take 1 tablet (5 mg total) by mouth daily.    Calcium  Carbonate Antacid (TUMS ULTRA 1000 PO) Take 1 tablet by mouth daily.    Cholecalciferol (VITAMIN D ) 50 MCG (2000 UT) CAPS Take 1 capsule by mouth daily.    ibuprofen (ADVIL) 200 MG tablet Take 400 mg by mouth every 6 (six) hours as needed. 04/05/2024: 400mg  8:30am   meloxicam (MOBIC) 15 MG tablet Take 1 tablet by mouth with food, once daily.  Take until your pain has resolved.    Multiple Vitamins-Minerals  (MULTIVITAMIN WITH MINERALS) tablet Take 2 tablets by mouth daily.  04/08/2022: Two gummies   rosuvastatin  (CRESTOR ) 10 MG tablet Take 1 tablet (10 mg total) by mouth daily.    Albuterol-Budesonide (AIRSUPRA ) 90-80 MCG/ACT AERO Inhale 2 Inhalations into the lungs every 4 (four) hours as needed (wheezing or shortness of breath). (Patient not taking: Reported on 04/05/2024)    naproxen sodium (ALEVE) 220 MG tablet Take 220 mg by mouth as needed. (Patient not taking: Reported on 04/05/2024) 03/06/2024: Took one this am   [DISCONTINUED] benzonatate  (TESSALON ) 200 MG capsule Take 1 capsule (200 mg total) by mouth 3 (three) times daily as needed for cough.    [DISCONTINUED] guaiFENesin (MUCINEX PO) Take 1 tablet by mouth in the morning, at noon, in the evening, and at bedtime. 03/06/2024: Last dose last night   No facility-administered encounter medications on file as of 04/05/2024.   NOT taking meloxicam prior to today's visit  No Known Allergies  ROS:  no f/c, Cough resolved--just a rare cough now. Not having significant allergy issues, occasional sneeze. Hurts to sneeze. R sided CP per HPI. No exertional CP, no SOB. No bleeding, bruising, rash, GI compaints.     PHYSICAL EXAM:  BP 100/60   Pulse 72   Ht 5' 7 (1.702 m)   Wt 144 lb 6.4 oz (65.5 kg)   LMP 05/23/2018   BMI 22.62 kg/m   Pleasant, well-appearing female in no distress HEENT: conjunctiva  and sclera are clear, EOMI. Neck: no lymphadenopathy.  ?possible small nodule noted at L thyroid? nontender Heart: regular rate and rhythm, no murmur Lungs: clear bilaterally Back: no spinal or CVA tenderness Chest wall: Tender at anterior lower ribs, at mid-clavicular line Slightly tender also at medial cartilage but most of her discomfort is along the ribs or intercostal muscles below the breast.  Abdomen: soft. Very mild tenderness in epigastrium.  No hepatosplenomegaly, negative Murphy sign Extremities: no edema Psych: slightly  anxious, full range of affect Neuro: cranial nerves grossly intact, normal gait.   ASSESSMENT/PLAN:  Rib pain on right side - R chest wall pain. Ddx reviewed, suspect poss intercostal muscle strain/pull.  NSAIDs, heat, rest. Imaging if not improving - Plan: meloxicam (MOBIC) 15 MG tablet  ?L sided nodule on thyroid--recheck next week at her physical. NSAID risks and OTC measures for pain relief reviewed in detail. All questions answered.    Stop taking the ibuprofen. Instead take meloxicam once daily with food.  Take this every day until your pain has completely resolved (okay to take the full #15 if needed, but you don't have to finish the bottle if you are better sooner). You may use tylenol along with the meloxicam if needed. Do not take ibuprofen/motrin/advil, or naproxen/aleve or BC or Goody powder. You can continue to try some heat, Biofreeze. I suspect it may be related to an intercostal muscle that might have pulled or is inflamed where it inserts on to the ribs.  Rest and anti-inflammatories should help.  If your pain doesn't resolve, we can consider imaging ro further evaluate the ribs.

## 2024-04-11 NOTE — Progress Notes (Unsigned)
 No chief complaint on file.  Martha Barton is a 53 y.o. female who presents for a complete physical.    She was seen 10/15 with R sided lower rib pain x 3 weeks. (Had been seen the month prior with L sided CP and cough). Pain was felt to be MSK, suspected poss intercostal muscle strain/pull. She was prescribed a course of meloxicam, and was encouraged to use heat, rest. Imaging if not improving.  Today she reports ***   She is under the care of cardiologist, referred when having left sided chest pain, palpitations and some elevated blood pressures. She had Zio patch, CT calcium  score and echo. The monitor showed sinus bradycardia to sinus tachycardia. 6 very short runs of SVT, one episode of second degree Mobitz type AV block during sleeping hours. Normal coronary CTA with no evidence for CAD (calcium  score of 0). She last saw Dr. Lonni in 01/2024.  Amlodipine  prescription was changed to 5 mg tablet (no change in dose--had been taking both 2.5 mg tablets together, no longer taking BID). This is for possible vasospasm/microvascular angina. She is tolerating this without side effects. She has occasional palpitations.  She denies headaches, dizziness, shortness of breath or edema.  BP Readings from Last 3 Encounters:  04/05/24 100/60  03/06/24 118/72  01/27/24 128/86     Hyperlipidemia:  She was started on Crestor  in 05/2020 (failed trial of RYR, LDL 168, and also d/t strong FHx CAD). Lipids have been at goal, most recently done through her insurance. ***??  Lab Results  Component Value Date   CHOL 134 09/21/2022   HDL 61 09/21/2022   LDLCALC 60 09/21/2022   TRIG 63 09/21/2022   CHOLHDL 2.4 08/28/2021     Postmenopausal, no cycle since the end of 2019. +hot flashes and night sweats, fluctuates in frequency and severity.  She does note some vaginal dryness and dyspareunia.   Vitamin D  deficiency:  Level was low at 26 in 2014.  Last level was normal at 45 in 03/2020,  when taking MVI gummy daily, as well as 1000 IU of Vitamin D  daily. She currently still takes both supplements.  Component Ref Range & Units (hover) 4 yr ago 8 yr ago 11 yr ago  Vit D, 25-Hydroxy 45.0 38 R, CM 26 Low  R, CM     Immunization History  Administered Date(s) Administered   Influenza Split 05/08/2009, 05/09/2010, 04/03/2023   Influenza, Seasonal, Injecte, Preservative Fre 04/02/2024   Influenza,inj,Quad PF,6+ Mos 02/12/2016, 02/20/2019, 03/25/2020, 04/08/2022   Influenza-Unspecified 03/24/2021   PFIZER Comirnaty (Gray Top)Covid-19 Tri-Sucrose Vaccine 04/08/2022   PFIZER(Purple Top)SARS-COV-2 Vaccination 09/10/2019, 10/01/2019, 07/08/2020   Pfizer Covid-19 Vaccine Bivalent Booster 41yrs & up 03/24/2021   Pfizer(Comirnaty )Fall Seasonal Vaccine 12 years and older 04/03/2023   Td 06/22/2005   Tdap 02/12/2016   Zoster Recombinant(Shingrix) 04/10/2021, 07/11/2021   Last Pap smear: 03/2021 normal, no high risk HPV detected Last mammogram: 03/2024 Last colonoscopy: 04/2020, Dr. Rollin.  Diverticulosis Last DEXA: never Dentist: twice yearly Ophtho:  every 1-2 years Exercise:  [facility]--gets weight-bearing exercise in her classes. Also does some walking.   PMH, PSH, SH and FH were reviewed and updated     ROS:  The patient denies anorexia, fever, vision changes, decreased hearing, ear pain, sore throat, breast concerns, syncope, dyspnea on exertion, cough, swelling, nausea, vomiting, diarrhea, constipation, abdominal pain, melena, hematochezia, incontinence, dysuria, genital lesions, joint pains, numbness, tingling, weakness, tremor, depression, anxiety, abnormal bleeding/bruising, or enlarged lymph nodes. Denies  changes in skin/bowels/moods/energy  Noted some continued hair loss the last few years, gradual thinning. No patches of alopecia. Denies vaginal bleeding, discharge, odor, itch. +vaginal dryness and pain with intercourse. Hot flashes, night sweats,  tolerable.   Infrequent palpitations. R sided chest pain  Epigastric pain??   PHYSICAL EXAM:  LMP 05/23/2018    Wt Readings from Last 3 Encounters:  04/05/24 144 lb 6.4 oz (65.5 kg)  03/06/24 143 lb 6.4 oz (65 kg)  01/27/24 144 lb 14.4 oz (65.7 kg)    General Appearance:    Alert, cooperative, no distress, appears stated age  Head:    Normocephalic, without obvious abnormality, atraumatic  Eyes:    PERRL, conjunctiva/corneas clear, EOM's intact, fundi benign  Ears:    Normal TM's and external ear canals  Nose:   No drainage or sinus tenderness  Throat:   Normal mucosa, no lesions  Neck:   Supple, no lymphadenopathy;  thyroid:  no enlargement/ tenderness/nodules; no carotid bruit or JVD  Back:    Spine nontender, no curvature, ROM normal, no CVA tenderness  Lungs:     Clear to auscultation bilaterally without wheezes, rales or ronchi; respirations unlabored  Chest Wall:    Nontender, no deformity   Heart:    Regular rate and rhythm, S1 and S2 normal, no murmur, rub   or gallop. No ectopy  Breast Exam:    No tenderness, masses, or nipple discharge or inversion.  No axillary lymphadenopathy.   Abdomen:     Soft, non-tender, nondistended, normoactive bowel sounds,    no masses, no hepatosplenomegaly.   Genitalia:    Normal external genitalia without lesions.  BUS and vagina normal. No abnormal discharge or cervical motion tenderness.  Uterus and adnexa not enlarged, nontender, no masses.  Pap not performed  Rectal:    Normal sphincter tone, no mass.  Heme negative stool.  Extremities:   No clubbing, cyanosis or edema.  Pulses:   2+ and symmetric all extremities  Skin:   Skin color, texture, turgor normal, no rashes.   Lymph nodes:   Cervical, supraclavicular, inguinal and axillary nodes normal  Neurologic:   Normal strength, sensation and gait; reflexes 2+ and symmetric throughout                Psych:   Normal Barton, affect, hygiene and grooming      04/12/2023    1:48 PM  04/08/2022    9:51 AM 04/02/2021    9:49 AM 03/25/2020   10:13 AM 03/09/2019    3:04 PM  Depression screen PHQ 2/9  Decreased Interest 0 0 0 0 0  Down, Depressed, Hopeless 0 0 0 0 0  PHQ - 2 Score 0 0 0 0 0       04/12/2023    1:48 PM  GAD 7 : Generalized Anxiety Score  Nervous, Anxious, on Edge 3  Control/stop worrying 0  Worry too much - different things 0  Trouble relaxing 0  Restless 0  Easily annoyed or irritable 0  Afraid - awful might happen 0  Total GAD 7 Score 3  Anxiety Difficulty Not difficult at all      ASSESSMENT/PLAN:  Cbc, c-met, lipids D if not taking supplements TSH if lots of palpitations or other symptoms (normal last year).   GAD-7 if having any anxiety COVID, prevnar-20 rec  (got flu from pharmacy 10/12)  Did she have labs done for her insurance?  I can't remember if she brought these in or  just mentioned them.  Results?   Discussed monthly self breast exams and yearly mammograms; at least 30 minutes of aerobic activity at least 5 days/week, weight-bearing exercise at least 2x/week; proper sunscreen use reviewed; healthy diet, including goals of calcium  and vitamin D  intake and alcohol recommendations (less than or equal to 1 drink/day) reviewed; regular seatbelt use; changing batteries in smoke detectors.  Immunization recommendations--continue yearly flu shots. COVID booster*** Prevnar-20 *** Colonoscopy UTD.   F/u 1 year, sooner prn.

## 2024-04-11 NOTE — Patient Instructions (Incomplete)
  HEALTH MAINTENANCE RECOMMENDATIONS:  It is recommended that you get at least 30 minutes of aerobic exercise at least 5 days/week (for weight loss, you may need as much as 60-90 minutes). This can be any activity that gets your heart rate up. This can be divided in 10-15 minute intervals if needed, but try and build up your endurance at least once a week.  Weight bearing exercise is also recommended twice weekly.  Eat a healthy diet with lots of vegetables, fruits and fiber.  Colorful foods have a lot of vitamins (ie green vegetables, tomatoes, red peppers, etc).  Limit sweet tea, regular sodas and alcoholic beverages, all of which has a lot of calories and sugar.  Up to 1 alcoholic drink daily may be beneficial for women (unless trying to lose weight, watch sugars).  Drink a lot of water.  Calcium  recommendations are 1200-1500 mg daily (1500 mg for postmenopausal women or women without ovaries), and vitamin D  1000 IU daily.  This should be obtained from diet and/or supplements (vitamins), and calcium  should not be taken all at once, but in divided doses.  Monthly self breast exams and yearly mammograms for women over the age of 50 is recommended.  Sunscreen of at least SPF 30 should be used on all sun-exposed parts of the skin when outside between the hours of 10 am and 4 pm (not just when at beach or pool, but even with exercise, golf, tennis, and yard work!)  Use a sunscreen that says broad spectrum so it covers both UVA and UVB rays, and make sure to reapply every 1-2 hours.  Remember to change the batteries in your smoke detectors when changing your clock times in the spring and fall. Carbon monoxide detectors are recommended for your home.  Use your seat belt every time you are in a car, and please drive safely and not be distracted with cell phones and texting while driving.  We discussed hormone replacement and vaginal estrogen to treat postmenopausal symptoms. Let us  know if you become  more symptomatic and desire treatment.  We discussed the fairly new recommendation for a pneumonia vaccine--either capvaxive-21 or Prevnar-20.  You should get one OR the other, just once, no boosters needed. You can either get this from the pharmacy, or we can give the Prevnar-20 at our office (nurse visit).  I am not sure when Dr. Rollin told you that your next colonoscopy was due.  You didn't have any polyps, but based on your family history (first degree relative--dad) it should likely be a 5 year follow-up (04/2025).

## 2024-04-12 ENCOUNTER — Ambulatory Visit: Payer: Commercial Managed Care - PPO | Admitting: Family Medicine

## 2024-04-12 ENCOUNTER — Encounter: Payer: Self-pay | Admitting: Family Medicine

## 2024-04-12 VITALS — BP 122/72 | HR 76 | Ht 67.0 in | Wt 143.6 lb

## 2024-04-12 DIAGNOSIS — E7800 Pure hypercholesterolemia, unspecified: Secondary | ICD-10-CM | POA: Diagnosis not present

## 2024-04-12 DIAGNOSIS — Z0000 Encounter for general adult medical examination without abnormal findings: Secondary | ICD-10-CM

## 2024-04-12 DIAGNOSIS — N959 Unspecified menopausal and perimenopausal disorder: Secondary | ICD-10-CM

## 2024-04-12 DIAGNOSIS — I10 Essential (primary) hypertension: Secondary | ICD-10-CM | POA: Diagnosis not present

## 2024-04-12 DIAGNOSIS — E041 Nontoxic single thyroid nodule: Secondary | ICD-10-CM

## 2024-04-12 DIAGNOSIS — Z5181 Encounter for therapeutic drug level monitoring: Secondary | ICD-10-CM

## 2024-04-12 DIAGNOSIS — E559 Vitamin D deficiency, unspecified: Secondary | ICD-10-CM | POA: Diagnosis not present

## 2024-04-12 DIAGNOSIS — R0789 Other chest pain: Secondary | ICD-10-CM

## 2024-04-12 DIAGNOSIS — L659 Nonscarring hair loss, unspecified: Secondary | ICD-10-CM

## 2024-04-12 LAB — LIPID PANEL

## 2024-04-13 ENCOUNTER — Ambulatory Visit: Payer: Self-pay | Admitting: Family Medicine

## 2024-04-13 DIAGNOSIS — E041 Nontoxic single thyroid nodule: Secondary | ICD-10-CM

## 2024-04-13 LAB — COMPREHENSIVE METABOLIC PANEL WITH GFR
ALT: 22 IU/L (ref 0–32)
AST: 38 IU/L (ref 0–40)
Albumin: 5.2 g/dL — AB (ref 3.8–4.9)
Alkaline Phosphatase: 79 IU/L (ref 49–135)
BUN/Creatinine Ratio: 22 (ref 9–23)
BUN: 16 mg/dL (ref 6–24)
Bilirubin Total: 0.4 mg/dL (ref 0.0–1.2)
CO2: 22 mmol/L (ref 20–29)
Calcium: 9.8 mg/dL (ref 8.7–10.2)
Chloride: 101 mmol/L (ref 96–106)
Creatinine, Ser: 0.72 mg/dL (ref 0.57–1.00)
Globulin, Total: 2.5 g/dL (ref 1.5–4.5)
Glucose: 84 mg/dL (ref 70–99)
Potassium: 4.3 mmol/L (ref 3.5–5.2)
Sodium: 140 mmol/L (ref 134–144)
Total Protein: 7.7 g/dL (ref 6.0–8.5)
eGFR: 100 mL/min/1.73 (ref >59)

## 2024-04-13 LAB — CBC WITH DIFFERENTIAL/PLATELET
Basophils Absolute: 0 x10E3/uL (ref 0.0–0.2)
Basos: 1 %
EOS (ABSOLUTE): 0 x10E3/uL (ref 0.0–0.4)
Eos: 0 %
Hematocrit: 40.6 % (ref 34.0–46.6)
Hemoglobin: 13.7 g/dL (ref 11.1–15.9)
Immature Grans (Abs): 0 x10E3/uL (ref 0.0–0.1)
Immature Granulocytes: 0 %
Lymphocytes Absolute: 1.8 x10E3/uL (ref 0.7–3.1)
Lymphs: 25 %
MCH: 30.5 pg (ref 26.6–33.0)
MCHC: 33.7 g/dL (ref 31.5–35.7)
MCV: 90 fL (ref 79–97)
Monocytes Absolute: 0.3 x10E3/uL (ref 0.1–0.9)
Monocytes: 4 %
Neutrophils Absolute: 5.1 x10E3/uL (ref 1.4–7.0)
Neutrophils: 70 %
Platelets: 298 x10E3/uL (ref 150–450)
RBC: 4.49 x10E6/uL (ref 3.77–5.28)
RDW: 12.3 % (ref 11.7–15.4)
WBC: 7.2 x10E3/uL (ref 3.4–10.8)

## 2024-04-13 LAB — LIPID PANEL
Cholesterol, Total: 169 mg/dL (ref 100–199)
HDL: 78 mg/dL (ref >39)
LDL CALC COMMENT:: 2.2 ratio (ref 0.0–4.4)
LDL Chol Calc (NIH): 78 mg/dL (ref 0–99)
Triglycerides: 67 mg/dL (ref 0–149)
VLDL Cholesterol Cal: 13 mg/dL (ref 5–40)

## 2024-04-13 LAB — TSH: TSH: 1.17 u[IU]/mL (ref 0.450–4.500)

## 2024-05-01 ENCOUNTER — Ambulatory Visit
Admission: RE | Admit: 2024-05-01 | Discharge: 2024-05-01 | Disposition: A | Discharge status: Home / Self Care | Source: Ambulatory Visit | Attending: Family Medicine | Admitting: Family Medicine

## 2024-05-01 DIAGNOSIS — E041 Nontoxic single thyroid nodule: Secondary | ICD-10-CM

## 2024-06-07 ENCOUNTER — Inpatient Hospital Stay: Admission: RE | Admit: 2024-06-07 | Discharge: 2024-06-07 | Attending: Family Medicine | Admitting: Family Medicine

## 2024-06-07 ENCOUNTER — Other Ambulatory Visit (HOSPITAL_COMMUNITY): Admission: RE | Admit: 2024-06-07 | Source: Ambulatory Visit

## 2024-06-07 DIAGNOSIS — E041 Nontoxic single thyroid nodule: Secondary | ICD-10-CM

## 2024-06-10 LAB — CYTOLOGY - NON PAP

## 2024-06-13 ENCOUNTER — Ambulatory Visit: Payer: Self-pay | Admitting: Family Medicine

## 2024-06-23 ENCOUNTER — Ambulatory Visit: Payer: Self-pay | Admitting: Family Medicine

## 2024-06-23 ENCOUNTER — Encounter (HOSPITAL_COMMUNITY): Payer: Self-pay

## 2025-04-12 ENCOUNTER — Encounter: Payer: Self-pay | Admitting: Family Medicine
